# Patient Record
Sex: Male | Born: 1935 | Race: White | Hispanic: No | Marital: Married | Smoking: Former smoker
Health system: Southern US, Community
[De-identification: ages and names within clinical notes are randomized; demographics above are authoritative.]

## PROBLEM LIST (undated history)

## (undated) DIAGNOSIS — F0390 Unspecified dementia without behavioral disturbance: Secondary | ICD-10-CM

## (undated) DIAGNOSIS — K222 Esophageal obstruction: Secondary | ICD-10-CM

## (undated) DIAGNOSIS — M1990 Unspecified osteoarthritis, unspecified site: Secondary | ICD-10-CM

## (undated) DIAGNOSIS — K219 Gastro-esophageal reflux disease without esophagitis: Secondary | ICD-10-CM

## (undated) DIAGNOSIS — E785 Hyperlipidemia, unspecified: Secondary | ICD-10-CM

## (undated) DIAGNOSIS — I10 Essential (primary) hypertension: Secondary | ICD-10-CM

## (undated) HISTORY — PX: OTHER SURGICAL HISTORY: SHX169

## (undated) SURGERY — EGD (ESOPHAGOGASTRODUODENOSCOPY)
Anesthesia: Moderate Sedation

---

## 2004-03-20 ENCOUNTER — Ambulatory Visit (HOSPITAL_COMMUNITY): Admission: RE | Admit: 2004-03-20 | Discharge: 2004-03-20 | Payer: Self-pay | Admitting: Cardiology

## 2004-03-24 ENCOUNTER — Ambulatory Visit (HOSPITAL_COMMUNITY): Admission: RE | Admit: 2004-03-24 | Discharge: 2004-03-24 | Payer: Self-pay | Admitting: Cardiology

## 2004-03-25 ENCOUNTER — Ambulatory Visit (HOSPITAL_COMMUNITY): Admission: RE | Admit: 2004-03-25 | Discharge: 2004-03-25 | Payer: Self-pay | Admitting: Cardiology

## 2004-08-18 ENCOUNTER — Ambulatory Visit (HOSPITAL_BASED_OUTPATIENT_CLINIC_OR_DEPARTMENT_OTHER): Admission: RE | Admit: 2004-08-18 | Discharge: 2004-08-18 | Payer: Self-pay | Admitting: Family Medicine

## 2005-02-05 ENCOUNTER — Ambulatory Visit: Payer: Self-pay | Admitting: Cardiology

## 2005-05-24 ENCOUNTER — Ambulatory Visit (HOSPITAL_COMMUNITY): Admission: RE | Admit: 2005-05-24 | Discharge: 2005-05-24 | Payer: Self-pay | Admitting: Gastroenterology

## 2006-03-03 ENCOUNTER — Ambulatory Visit: Payer: Self-pay | Admitting: Cardiology

## 2007-03-20 ENCOUNTER — Ambulatory Visit: Payer: Self-pay | Admitting: Cardiology

## 2007-03-29 ENCOUNTER — Ambulatory Visit: Payer: Self-pay

## 2007-03-29 LAB — CONVERTED CEMR LAB
Chloride: 106 meq/L (ref 96–112)
Cholesterol: 261 mg/dL (ref 0–200)
Direct LDL: 177 mg/dL
GFR calc Af Amer: 123 mL/min
GFR calc non Af Amer: 102 mL/min
HDL: 48.3 mg/dL (ref >39.0)
Potassium: 4.7 meq/L (ref 3.5–5.1)
Sodium: 142 meq/L (ref 135–145)
Total CHOL/HDL Ratio: 5.4
Triglycerides: 165 mg/dL — ABNORMAL HIGH (ref 0–149)
VLDL: 33 mg/dL (ref 0–40)

## 2007-06-02 ENCOUNTER — Ambulatory Visit: Payer: Self-pay | Admitting: Cardiology

## 2007-06-02 LAB — CONVERTED CEMR LAB
ALT: 43 units/L (ref 0–53)
AST: 39 units/L — ABNORMAL HIGH (ref 0–37)
Alkaline Phosphatase: 55 units/L (ref 39–117)
Total Bilirubin: 1.1 mg/dL (ref 0.3–1.2)
Total Protein: 7.5 g/dL (ref 6.0–8.3)

## 2008-03-07 ENCOUNTER — Ambulatory Visit: Payer: Self-pay

## 2008-03-07 ENCOUNTER — Ambulatory Visit: Payer: Self-pay | Admitting: Cardiology

## 2008-03-07 LAB — CONVERTED CEMR LAB
Basophils Absolute: 0.1 10*3/uL (ref 0.0–0.1)
Chloride: 104 meq/L (ref 96–112)
Creatinine, Ser: 0.8 mg/dL (ref 0.4–1.5)
Eosinophils Absolute: 0.1 10*3/uL (ref 0.0–0.7)
GFR calc Af Amer: 123 mL/min
GFR calc non Af Amer: 101 mL/min
HCT: 44.7 % (ref 39.0–52.0)
MCHC: 34.2 g/dL (ref 30.0–36.0)
MCV: 98.5 fL (ref 78.0–100.0)
Magnesium: 2.1 mg/dL (ref 1.5–2.5)
Monocytes Absolute: 0.6 10*3/uL (ref 0.1–1.0)
Neutrophils Relative %: 58.8 % (ref 43.0–77.0)
Platelets: 223 10*3/uL (ref 150–400)
Potassium: 4.1 meq/L (ref 3.5–5.1)

## 2009-01-03 ENCOUNTER — Ambulatory Visit (HOSPITAL_COMMUNITY): Admission: RE | Admit: 2009-01-03 | Discharge: 2009-01-03 | Payer: Self-pay | Admitting: Gastroenterology

## 2009-04-29 ENCOUNTER — Ambulatory Visit (HOSPITAL_COMMUNITY): Admission: RE | Admit: 2009-04-29 | Discharge: 2009-04-29 | Payer: Self-pay | Admitting: Gastroenterology

## 2009-05-14 ENCOUNTER — Ambulatory Visit (HOSPITAL_COMMUNITY): Admission: RE | Admit: 2009-05-14 | Discharge: 2009-05-14 | Payer: Self-pay | Admitting: Gastroenterology

## 2009-05-19 ENCOUNTER — Encounter: Admission: RE | Admit: 2009-05-19 | Discharge: 2009-05-19 | Payer: Self-pay | Admitting: Family Medicine

## 2010-11-04 ENCOUNTER — Ambulatory Visit (HOSPITAL_COMMUNITY): Admission: RE | Admit: 2010-11-04 | Discharge: 2010-11-04 | Payer: Self-pay | Admitting: Gastroenterology

## 2010-12-27 ENCOUNTER — Encounter: Payer: Self-pay | Admitting: Gastroenterology

## 2011-03-29 ENCOUNTER — Other Ambulatory Visit (HOSPITAL_COMMUNITY): Payer: Self-pay | Admitting: Gastroenterology

## 2011-04-02 ENCOUNTER — Ambulatory Visit (HOSPITAL_COMMUNITY)
Admission: RE | Admit: 2011-04-02 | Discharge: 2011-04-02 | Disposition: A | Discharge status: Home / Self Care | Payer: Medicare Other | Source: Ambulatory Visit | Attending: Gastroenterology | Admitting: Gastroenterology

## 2011-04-02 DIAGNOSIS — R05 Cough: Secondary | ICD-10-CM | POA: Insufficient documentation

## 2011-04-02 DIAGNOSIS — K219 Gastro-esophageal reflux disease without esophagitis: Secondary | ICD-10-CM | POA: Insufficient documentation

## 2011-04-02 DIAGNOSIS — R0989 Other specified symptoms and signs involving the circulatory and respiratory systems: Secondary | ICD-10-CM | POA: Insufficient documentation

## 2011-04-02 DIAGNOSIS — R059 Cough, unspecified: Secondary | ICD-10-CM | POA: Insufficient documentation

## 2011-04-20 NOTE — Op Note (Signed)
Jeremy Briggs, PRIVITERA                ACCOUNT NO.:  [id number]   MEDICAL RECORD NO.:  [id number]          PATIENT TYPE:  AMB   LOCATION:  ENDO                         FACILITY:  [facility]   PHYSICIAN:  Shirley Friar, MDDATE OF BIRTH:  [birth year]   DATE OF PROCEDURE:  01/03/2009  DATE OF DISCHARGE:                               OPERATIVE REPORT   INDICATIONS:  Food impaction, history of esophageal stricture.   MEDICATIONS:  Fentanyl 125 mcg IV, Versed 12.5 mg IV.   FINDINGS:  Endoscope was inserted through the oropharynx and esophagus  was intubated.  The endoscope was passed down into the distal part of  the esophagus where large food bolus was obscuring the entire lumen of  the esophagus.  A Lucina Mellow net was used with multiple passes to try and  remove this food bolus, but it was only partially successful.  A rat  tooth forceps was then used to pull off pieces of this food bolus and  this was used on several attempts.  Majority of the food bolus still  remained in place and decision was made to use a tripod.  A tripod was  inserted and with repeated passes, majority of the food bolus was  removed with the tripod.  The remaining portion of the food bolus could  be gently advanced into the lumen of the stomach.  This revealed a  benign-appearing distal esophageal stricture with evidence of mucosal  erythema, edema, and ulceration due to pressure from the food bolus.  Endoscope was then advanced down into the stomach, which revealed normal  distal stomach.  The endoscope was retroflexed and small hiatal hernia  was seen.  Endoscope was straightened and advanced to the duodenal bulb  and second portion of duodenum, which were both normal.  Endoscope was  withdrawn back into the stomach and large amount of food particles and  liquid were seen in the dependent portion of the fundus.  Endoscope was  withdrawn to confirm the above findings.   ASSESSMENT:  1. Large food bolus blocking the  lumen of the distal esophagus,      removed with various techniques as stated above.  2. Benign appearing distal esophageal stricture.  3. Small hiatal hernia.   PLAN:  1. Increase proton pump inhibitor to twice a day by mouth.  2. Repeat upper endoscopy in 2-3 weeks for balloon dilation versus      Savary dilation.  3. OV in 2 weeks.  4. Liquid diet and advance slowly as tolerated.      Shirley Friar, MD  Electronically Signed    VCS/MEDQ  D:  01/03/2009  T:  01/04/2009  Job:  [id number]   cc:   Tally Joe, M.D.

## 2011-04-20 NOTE — Op Note (Signed)
NAMEMOTTY, BORIN                ACCOUNT NO.:  [id number]   MEDICAL RECORD NO.:  [id number]          PATIENT TYPE:  AMB   LOCATION:  ENDO                         FACILITY:  [facility]   PHYSICIAN:  Shirley Friar, MDDATE OF BIRTH:  Dec 24, 1933   DATE OF PROCEDURE:  DATE OF DISCHARGE:  05/14/2009                               OPERATIVE REPORT   INDICATION:  Dysphagia, history of esophageal stricture.   MEDICATIONS:  Fentanyl 50 mcg IV, Versed 3 mg IV, and Cetacaine spray  x2.   FINDINGS:  Endoscope was inserted into the oropharynx and esophagus was  intubated, which revealed a distal benign-appearing esophageal  stricture.  The standard endoscope was inserted through this without any  resistance down to the stomach which revealed normal stomach mucosa.  Retroflexion was done, which revealed normal proximal stomach.  Endoscope was straightened and advanced into the duodenal bulb and  second portion of duodenum which were both normal.  Endoscope was  withdrawn back into the esophagus and a small amount of blood from  endoscopic trauma from insertion through the esophageal stricture.  Decision was made to use a balloon dilator for dilation.  Balloon  dilator was started 15 mm x 5.5 cm and held for 1 minute across the  esophageal stricture.  The balloon was then inflated up to 16.5 mm x 5.5  cm after looking at the stricture following the 15-mm dilation.  Following this 16.5-mm dilation, there was evidence of successful  dilation of esophageal stricture.  Each balloon dilator was held for 1  minute in sequential fashion.  Endoscope was then withdrawn to confirm  above findings.   ASSESSMENT:  1. Distal esophageal stricture, status post balloon dilation of the      16.5 mm.  2. Otherwise, normal upper endoscopy.   PLAN:  1. No NSAIDs x14 days.  2. Advance diet as tolerated.  3. Continue PPI therapy daily.  4. Follow up in office in 6 weeks.      Shirley Friar, MD  Electronically Signed     VCS/MEDQ  D:  05/14/2009  T:  05/15/2009  Job:  [id number]   cc:   Melida Quitter, M.D.

## 2011-04-20 NOTE — Assessment & Plan Note (Signed)
[facility]                            CARDIOLOGY OFFICE NOTE   Jeremy Briggs, Jeremy Briggs                       MRN:          [id number]  DATE:03/07/2008                            DOB:          27-Dec-1933    PRIMARY CARE PHYSICIAN:  Holley Bouche, M.D.   CLINICAL HISTORY:  Jeremy Briggs is 77 years old and returns for evaluation  and management of palpitations.  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  We evaluated him last  year for symptoms of shortness of breath with a Myoview scan which was  negative and showed no evidence of ischemia.  He also had pulmonary  function tests which were only mildly abnormal and he had a CT scan of  the chest which was normal.  He previously had been a smoker, but  stopped.   Recently, he has had symptoms of palpitations and dizziness.  He  describes the palpitations as feeling his heart beating irregularly and  this happens daily and mostly at night when he lies down.  Dr. Tiburcio Pea  saw him and did an ECG and told him he had APCs.  He has also had some  dizziness which he describes as a feeling of unsteadiness which  sometimes is related to position and sometimes not.   PAST MEDICAL HISTORY:  1. Significant for hypertension.  2. Hyperlipidemia.  3. Obstructive sleep apnea, although he does not use BiPAP.   ALLERGIES:  1. STATINS.  2. ZETIA.   CURRENT MEDICATIONS:  1. Aspirin.  2. Omega 3.  3. Glucosamine.  4. Prilosec.  5. Diovan 160 mg daily.   PHYSICAL EXAMINATION:  VITAL SIGNS:  Blood pressure 159/84, pulse 84 and  regular.  NECK:  There was no venous distension.  Carotid pulses were full without  bruits.  CHEST:  Clear.  CARDIAC:  Rhythm was somewhat irregular.  There were no murmurs and no  gallops.  The heart sounds were normal.  ABDOMEN:  Soft with normal bowel sounds.  There is no  hepatosplenomegaly.  EXTREMITIES:  Peripheral pulses were full.   There  is no peripheral edema.   DIAGNOSTICS:  Electrocardiogram showed minor nonspecific ST/T changes.   IMPRESSION:  1. Palpitations with documented atrial premature contractions on 12-      lead electrocardiogram.  2. Mild nonobstructive coronary disease at cath in 2005.  3. Hyperlipidemia intolerant to statins.  4. Hypertension.  5. Positive family history for coronary heart disease.  6. Obstructive sleep apnea intolerant to bilevel positive airway      pressure.   RECOMMENDATIONS:  Jeremy Briggs is symptomatic from his palpitations.  I  think the important thing is to see if there is any atrial fibrillation  which would put him at risk of stroke and which might require further  anticoagulant therapy.  We will plan to get a 24 hour Holter monitor  since he has symptoms every day.  After the monitor, we will start him  on atenolol 25 a day.  I will decide about followup after we see  the  results of his monitor.  We will also get a CBC, BMP, TSH and magnesium.     Bruce Elvera Lennox Juanda Chance, MD, [facility]  Electronically Signed    BRB/MedQ  DD: 03/07/2008  DT: 03/07/2008  Job #: [id number]

## 2011-04-20 NOTE — Op Note (Signed)
NAMECOLTRANE, TUGWELL                ACCOUNT NO.:  [id number]   MEDICAL RECORD NO.:  [id number]          PATIENT TYPE:  AMB   LOCATION:  ENDO                         FACILITY:  [facility]   PHYSICIAN:  Danise Edge, M.D.   DATE OF BIRTH:  1934-09-09   DATE OF PROCEDURE:  DATE OF DISCHARGE:  04/29/2009                               OPERATIVE REPORT   REFERRING PHYSICIANS:  1. Shirley Friar, MD  2. Noberto Retort, MD   HISTORY:  Mr. Jeremy Briggs is a 77 year old male born 12-14-1933.  Mr. Jeremy Briggs comes into the endoscopy suite this morning with his second  food bolus obstruction of the distal esophagus due to a distal  esophageal stricture which he declined to have dilated after his first  obstruction.  He was eating cube steak last night which initiated his  esophageal obstruction.  He is unable to swallow saliva.   MEDICATION ALLERGIES:  None.   CHRONIC MEDICATIONS:  Diovan, 81 mg aspirin, sertraline, multivitamin,  fish oil, glucosamine, and omeprazole.   PAST MEDICAL HISTORY:  1. Gastroesophageal reflux associated with a hiatal hernia.  2. Chronic obstructive pulmonary disease.  3. Obstructive sleep apnea syndrome.   PAST SURGICAL HISTORY:  No previous surgeries.   FAMILY HISTORY:  Negative for colon cancer or colon polyps.   SOCIAL HISTORY:  The patient does not smoke cigarettes.  He consumes  alcohol in moderation.  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  He is married.   ENDOSCOPIST:  Danise Edge, MD   PREMEDICATIONS:  Fentanyl 75 mcg and Versed 5 mg.   PROCEDURE:  After obtaining informed consent, Jeremy Briggs was placed in  the left lateral decubitus position.  I administered intravenous  fentanyl and intravenous Versed to achieve conscious sedation for the  procedure.  The patient's blood pressure, oxygen saturation and cardiac  rhythm were monitored throughout the procedure and documented in the  medical record.   The Pentax gastroscope was  passed through the posterior hypopharynx into  the proximal esophagus without difficulty.  The hypopharynx, larynx and  vocal cords appeared normal.   ESOPHAGOSCOPY:  The proximal and mid segments of the esophagus appeared  normal.  There is meat, totally obstructing the distal esophagus.  Using  the [employer] a portion of the obstructing meat bolus was removed.  With  gentle pressure with the endoscope the remaining obstructing meat bolus  was pushed into the stomach.  The squamocolumnar junction is noted to be  40 cm from the incisor teeth.  The distal esophagus is quite inflamed  and friable due to the obstructing meat bolus.  There is a benign  appearing stricture at the esophagogastric junction noted at 40 cm from  the incisor teeth.   GASTROSCOPY:  There was a moderate-sized hiatal hernia.  Retroflexed  view of the gastric cardia and fundus was normal.  The gastric body,  antrum and pylorus appeared normal.   DUODENOSCOPY:  The duodenal bulb and descending duodenum appeared  normal.   ASSESSMENT:  Chronic gastroesophageal reflux disease associated with a  hiatal  hernia complicated by a benign peptic stricture at the  esophagogastric junction (40 cm from the incisor teeth).  Obstructing  meat bolus in the distal esophagus relieved.   RECOMMENDATIONS:  I will ask the patient to remain off multivitamins,  aspirin and meat until he can get his esophageal stricture dilated.           ______________________________  Danise Edge, M.D.     MJ/MEDQ  D:  04/29/2009  T:  04/30/2009  Job:  [id number]   cc:   Shirley Friar, MD  Melida Quitter, M.D.

## 2011-04-23 NOTE — Cardiovascular Report (Signed)
Jeremy Briggs, Jeremy Briggs                          ACCOUNT NO.:  [id number]   MEDICAL RECORD NO.:  [id number]                   PATIENT TYPE:  OIB   LOCATION:  2899                                 FACILITY:  [facility]   PHYSICIAN:  Armanda Magic, M.D.                  DATE OF BIRTH:  1934-06-18   DATE OF PROCEDURE:  03/20/2004  DATE OF DISCHARGE:                              CARDIAC CATHETERIZATION   REFERRING PHYSICIAN:  Hal T. Stoneking, M.D.   This is a 77 year old white male with a family history of coronary disease,  history of hyperlipidemia, and tobacco use in the past who presents with  dyspnea on exertion of unclear etiology. Also has new abnormal EKG. Stress  Cardiolite showing a partially reversible inferior septal defect with normal  EF. He now presents for cardiac catheterization.   The patient was brought to the cardiac catheterization laboratory in the  fasting nonsedated stated. Informed consent was obtained. The patient was  connected to continuous heart rate and pulse oximetry monitoring and  intermittent blood pressure monitoring. The right groin was prepped and  draped in sterile fashion. One percent Xylocaine was used for local  anesthesia. Using modified Seldinger technique, a 6-French sheath was placed  in the right femoral artery. Under fluoroscopic guidance, a 6-French JL4  catheter was placed in the left coronary artery. Multiple films were taken  in 30 degree RAO and 40 degree LAO views. This catheter was then exchanged  out over a guide wire for a 6-French JR4 catheter which was placed under  fluoroscopic guidance in the right coronary artery. Multiple _________ films  were taken in 30 degree RAO and 40 degree LAO views. This catheter was then  exchanged out over a guide wire for a 6-French angled pigtailed catheter  which was placed under fluoroscopic guidance in the left ventricular cavity.  Left ventriculography was performed in 30 degree RAO views using a total  of  30 cc of contrast at 15 cc per second. The catheter was then pulled back  across the valve with no significant gradient noted. At the end of the  procedure, all catheters and sheaths were removed. Manual compression was  performed until adequate hemostasis was obtained. The patient was  transferred back to the room in stable condition.   RESULTS:  Left main coronary artery is widely patent. It bifurcates into the  left anterior descending and left circumflex artery. Left anterior  descending artery is widely patent throughout its course. The apex was a mid  40 to 50% narrowing between the first and second diagonal. Both diagonals  were widely patent. The left circumflex was a single vessel. It transverses  the AV groove. It has a 20% ostial narrowing but otherwise is widely patent  throughout its course. The right coronary artery is widely patent throughout  its course with luminal irregularities up to 20% and bifurcates distally  into  a posterior descending artery and posterolateral artery, both of which  are widely patent.   Left ventriculography shows normal LV systolic function and size. No MR. LV  pressure 175/24 mmHg. Aortic pressure 181/105 mmHg. LVEDP 27 mmHg.   ASSESSMENT:  1. Nonobstructive coronary disease.  2. Dyspnea on exertion of questionable etiology, probably either due to     underlying pulmonary abnormality from smoking history versus diastolic     dysfunction given his elevated LVEDP.  3. Hyperlipidemia on Crestor.  4. Borderline diabetes mellitus.   PLAN:  1. Discharged to home after bedrest and IV fluids.  2. Add Cardizem CD 180 mg a day for probable diastolic dysfunction.  3. Check a fasting lipid panel as an outpatient.  4. Check pulmonary function tests with DLCO as an outpatient.  5. Chest CT with contrast as an outpatient to rule out chronic pulmonary     embolism as a cause of shortness of breath.  6. Continue aspirin and Crestor.  7. Check a 2-D  echocardiogram to evaluate for left ventricular hypertrophy     versus end-diastolic dysfunction.                                               Armanda Magic, M.D.    TT/MEDQ  D:  03/20/2004  T:  03/20/2004  Job:  [id number]

## 2011-04-23 NOTE — Assessment & Plan Note (Signed)
[facility]                            CARDIOLOGY OFFICE NOTE   Jeremy Briggs, Jeremy Briggs                       MRN:          [id number]  DATE:03/20/2007                            DOB:          August 17, 1934    PRIMARY CARE PHYSICIAN:  Jeremy Briggs, M.D. with [facility].   CLINICAL COURSE:  Mr. Jeremy Briggs is 77 years old and is semi-retired.  He  works in Research officer, political party.  He has hypertension, hyperlipidemia and non-  obstructive coronary disease.  He had been previously cathed by Dr.  Mayford Briggs, and then I saw him at Jeremy Briggs' request.   I evaluated him a little more than a year ago for exertional dyspnea,  and we did pulmonary function tests which were close to normal, despite  a previous smoking history.  We also did a CT scan of the chest which  was normal, despite previous smoking.  We thought his dyspnea was  related to his conditioning.   He says his dyspnea has gotten somewhat worse over the past year.  He  has not had any chest pain associated with his dyspnea.   PAST MEDICAL HISTORY:  1. Hyperlipidemia.  2. Hypertension.  3. Obstructive sleep apnea, although he does not use BiPAP.   ALLERGIES:  He has been intolerant to STATINS.   CURRENT MEDICATIONS:  1. Aspirin.  2. Glucosamine.  3. Folic acid.  4. Prilosec.  5. Diovan.  6. Spiriva inhaler p.r.n., which was just recently given by Dr.      Tiburcio Briggs.   PHYSICAL EXAMINATION:  VITAL SIGNS:  On examination today, the blood  pressure was 177/87; on a repeat, I got 180/95, and the pulse was 77 and  regular.  NECK:  There was no venous distention.  The carotid pulses were full  without bruits.  CHEST:  Clear without rales or rhonchi.  CARDIAC:  Regular.  I could hear no murmurs or gallops.  ABDOMEN:  Soft without organomegaly.  Peripheral pulses were full.  There was no peripheral edema.   ELECTROCARDIOGRAM:  Minor nonspecific ST-T changes had not changed.   IMPRESSION:  1.  Dyspnea on exertion, somewhat worse over the past several months.      Rule out ischemia.  2. Nonobstructive coronary disease at catheterization in 2005.  3. Hypertension, now under good control.  4. Normal left ventricular function.  5. Hyperlipidemia.  6. Intolerance to statins.   RECOMMENDATIONS:  Jeremy Briggs blood pressure is now under good control.  Will increase his Diovan from 80 to 160 a day.  His dyspnea could  possibly be ischemic equivalent related to progression of his coronary  disease, and we did not evaluate him for this a year ago, so I think it  would be appropriate to do an exercise rest stress Myoview scan.  If  this is negative, then I would attribute his shortness of breath to  deconditioning and perhaps probably related to hypertension not under  optimal control.  Will recheck his blood pressure and a BNP when he  comes in for a stress test, and will  have him follow up with Jeremy Briggs  after that for further adjustments in therapy.     Jeremy Elvera Lennox Juanda Chance, MD, [facility]  Electronically Signed    BRB/MedQ  DD: 03/20/2007  DT: 03/21/2007  Job #: [id number]

## 2011-04-23 NOTE — Op Note (Signed)
Jeremy Briggs, Jeremy Briggs                ACCOUNT NO.:  [id number]   MEDICAL RECORD NO.:  [id number]          PATIENT TYPE:  AMB   LOCATION:  ENDO                         FACILITY:  [facility]   PHYSICIAN:  Danise Edge, M.D.   DATE OF BIRTH:  August 05, 1934   DATE OF PROCEDURE:  05/24/2005  DATE OF DISCHARGE:                                 OPERATIVE REPORT   PROCEDURE:  Esophagogastroduodenoscopy with Savary esophageal dilation.   INDICATIONS:  Mr. Norval Slaven is a 77 year old male born 11-01-1934.  Mr. Harriott has gastroesophageal reflux manifested by heartburn which is  controlled on his current dose of a proton pump inhibitor. He occasionally  has the sensation of large tablets that he swallows hanging up in the mid  esophagus. He reports no odynophagia or weight loss.   ENDOSCOPIST:  Danise Edge, M.D.   PREMEDICATION:  Versed 5 mg, Demerol 50 mg. .   DESCRIPTION OF PROCEDURE:  After obtaining informed consent, Mr. Pullin was  placed in the left lateral decubitus position on the fluoroscopy table. I  administered intravenous Demerol and intravenous Versed to achieve conscious  sedation for the procedure. The patient's blood pressure, oxygen saturation  and cardiac rhythm were monitored throughout the procedure and documented in  the medical record.   The Olympus gastroscope was passed through the posterior hypopharynx into  the proximal esophagus without difficulty. The hypopharynx, larynx and vocal  cords appeared normal.   ESOPHAGOSCOPY:  The proximal, mid and lower segments of the esophageal  mucosa appeared normal. The squamocolumnar junction is noted at 40 cm from  the incisor teeth. There may be a shallow stricture at the esophagogastric  junction. There is no endoscopic evidence for the presence of Barrett's  esophagus or erosive esophagitis.   GASTROSCOPY:  Mr. Nicklaus has a small hiatal hernia. Retroflexed view of the  gastric cardia and fundus was normal. The gastric  body, antrum and pylorus  appeared normal.   DUODENOSCOPY:  The duodenal bulb, mid duodenum and distal duodenum appeared  normal.   SAVARY ESOPHAGEAL DILATION:  The Savary dilator wire was passed through the  gastroscope and the tip of the guidewire advanced to the distal gastric  antrum as confirmed endoscopically and fluoroscopically. Under fluoroscopic  guidance, the 15 mm Savary dilator passed without resistance. Repeat  esophagogastroscopy revealed mucosal dilation associated with a scant amount  of fresh blood at the esophagogastric junction and no gastric trauma due to  the guidewire.   ASSESSMENT:  Gastroesophageal reflux associated with a small hiatal hernia  and shallow mucosal stricture at the esophagogastric junction (40 cm from  the incisor teeth) dilated with the 15 mm Savary dilator.   RECOMMENDATIONS:  Continue proton pump inhibitor therapy.       MJ/MEDQ  D:  05/24/2005  T:  05/24/2005  Job:  [id number]   cc:   Melida Quitter, M.D.  [address]  [place]  [place] [address]  Fax: [phone]

## 2011-04-23 NOTE — Procedures (Signed)
Jeremy Briggs, Jeremy Briggs                ACCOUNT NO.:  [id number]   MEDICAL RECORD NO.:  [id number]          PATIENT TYPE:  OUT   LOCATION:  SLEEP CENTER                 FACILITY:  [facility]   PHYSICIAN:  Clinton D. Maple Hudson, M.D. DATE OF BIRTH:  12-Oct-1934   DATE OF ADMISSION:  08/18/2004  DATE OF DISCHARGE:  08/18/2004                              NOCTURNAL POLYSOMNOGRAM   REFERRING PHYSICIAN:  Dr. Johny Blamer, IV   INDICATION FOR STUDY:  Hypersomnia with sleep apnea, nonrestorative sleep.   Epworth sleepiness score 1/24.  Neck size 17 inches.  BMI 26.  Weight 172  pounds.   MEDICATIONS:  1.  Diovan.  2.  Zocor.  3.  Multivitamins.  4.  Aspirin.  5.  Folic acid.  6.  Ambien or temazepam p.r.n.   Technician did not indicate that sleep medication was used this night.   SLEEP ARCHITECTURE:  Total sleep time 334 minutes with sleep efficiency 82%.  Stage I was 7%, stage II 75%, stages III and IV 2%.  REM was 15% of total  sleep time.  Latency to sleep onset 14 minutes.  Latency to REM 72 minutes.  Awake after sleep onset 59 minutes.  Arousal index 39.   RESPIRATORY DATA:  Split study protocol.  RDI 25/hour consistent with  moderate obstructive sleep apnea/hypopnea syndrome for CPAP titration.  This  included 40 obstructive apneas, 4 central apneas, 28 hypopneas.  Most events  were while sleeping supine.  REM RDI was 4.7/hour.  CPAP was titrated to 15  CWP, RDI 0/hour using a small Respironics Comfort Gel nasal mask with heated  humidifier.  Technician suggests a chin strap initially.   OXYGEN DATA:  Moderate to loud snoring with mild oxygen desaturation to a  nadir of 85% before CPAP.  After CPAP control, saturation held 95% on room  air.   CARDIAC DATA:  Normal sinus rhythm with rare PVC.   MOVEMENT/PARASOMNIA:  257 limb jerks were recorded of which 60 were  associated with arousal or awakening for a periodic limb movement with  arousal index of 10.8/hour which is abnormal.   IMPRESSION/RECOMMENDATION:  Moderate obstructive sleep apnea/hypopnea  syndrome, RDI 25/hour with mild oxygen desaturation.  Successful CPAP  control at 15 CWP, RDI 0/hour, using a small Respironics comfort gel  nasal mask with heated humidifier.  Consider adding a chin strap.  Periodic  limb movement with arousal, 10.8/hour.  This can be reconsidered after  treatment for CPAP if appropriate.      CDY/MEDQ  D:  08/27/2004 13:15:20  T:  08/28/2004 18:10:34  Job:  [id number]

## 2012-01-27 ENCOUNTER — Encounter (HOSPITAL_COMMUNITY): Payer: Self-pay

## 2012-01-27 ENCOUNTER — Encounter (HOSPITAL_COMMUNITY)
Admission: EM | Disposition: A | Discharge status: Home Health | Payer: Self-pay | Source: Home / Self Care | Attending: Emergency Medicine

## 2012-01-27 ENCOUNTER — Ambulatory Visit (HOSPITAL_COMMUNITY)
Admission: EM | Admit: 2012-01-27 | Discharge: 2012-01-27 | Disposition: A | Discharge status: Home Health | Payer: Medicare Other | Attending: Gastroenterology | Admitting: Gastroenterology

## 2012-01-27 DIAGNOSIS — IMO0002 Reserved for concepts with insufficient information to code with codable children: Secondary | ICD-10-CM | POA: Insufficient documentation

## 2012-01-27 DIAGNOSIS — I10 Essential (primary) hypertension: Secondary | ICD-10-CM | POA: Insufficient documentation

## 2012-01-27 DIAGNOSIS — E785 Hyperlipidemia, unspecified: Secondary | ICD-10-CM | POA: Insufficient documentation

## 2012-01-27 DIAGNOSIS — K449 Diaphragmatic hernia without obstruction or gangrene: Secondary | ICD-10-CM | POA: Insufficient documentation

## 2012-01-27 DIAGNOSIS — K222 Esophageal obstruction: Secondary | ICD-10-CM | POA: Insufficient documentation

## 2012-01-27 DIAGNOSIS — T18108A Unspecified foreign body in esophagus causing other injury, initial encounter: Secondary | ICD-10-CM | POA: Insufficient documentation

## 2012-01-27 DIAGNOSIS — R1310 Dysphagia, unspecified: Secondary | ICD-10-CM

## 2012-01-27 HISTORY — PX: ESOPHAGOGASTRODUODENOSCOPY: SHX5428

## 2012-01-27 HISTORY — DX: Gastro-esophageal reflux disease without esophagitis: K21.9

## 2012-01-27 HISTORY — DX: Esophageal obstruction: K22.2

## 2012-01-27 HISTORY — DX: Hyperlipidemia, unspecified: E78.5

## 2012-01-27 HISTORY — DX: Essential (primary) hypertension: I10

## 2012-01-27 SURGERY — EGD (ESOPHAGOGASTRODUODENOSCOPY)
Anesthesia: Moderate Sedation

## 2012-01-27 MED ORDER — FENTANYL CITRATE 0.05 MG/ML IJ SOLN
INTRAMUSCULAR | Status: AC
  Filled 2012-01-27: qty 2

## 2012-01-27 MED ORDER — ASPIRIN 81 MG PO TABS: 81.0000 mg | ORAL_TABLET | Freq: Every morning | ORAL | Status: DC

## 2012-01-27 MED ORDER — BUTAMBEN-TETRACAINE-BENZOCAINE 2-2-14 % EX AERO
INHALATION_SPRAY | CUTANEOUS | Status: DC | PRN
  Administered 2012-01-27: 2 via TOPICAL

## 2012-01-27 MED ORDER — MIDAZOLAM HCL 10 MG/2ML IJ SOLN
INTRAMUSCULAR | Status: AC
  Filled 2012-01-27: qty 2

## 2012-01-27 MED ORDER — FENTANYL NICU IV SYRINGE 50 MCG/ML
INJECTION | INTRAMUSCULAR | Status: DC | PRN
  Administered 2012-01-27 (×2): 25 ug via INTRAVENOUS

## 2012-01-27 MED ORDER — SODIUM CHLORIDE 0.9 % IV SOLN: Freq: Once | INTRAVENOUS | Status: DC

## 2012-01-27 MED ORDER — MIDAZOLAM HCL 10 MG/2ML IJ SOLN
INTRAMUSCULAR | Status: DC | PRN
  Administered 2012-01-27: 1 mg via INTRAVENOUS
  Administered 2012-01-27: 2 mg via INTRAVENOUS
  Administered 2012-01-27: 1 mg via INTRAVENOUS

## 2012-01-27 MED ORDER — SODIUM CHLORIDE 0.9 % IV SOLN
Freq: Once | INTRAVENOUS | Status: AC
  Administered 2012-01-27: 11:00:00 via INTRAVENOUS

## 2012-01-27 NOTE — ED Notes (Signed)
Pt states that since last night after eating an apple he feels like there is food stuck in his lower esophagus. He tried to eat some bread and drink water but he was unable to swallow and vomited everything back up. Pt has hx of the same and has been stretched a few times. Dr. Bosie Clos is pcp that helps with this issue.

## 2012-01-27 NOTE — Discharge Instructions (Signed)
Clear liquids only today slowly advance to soft solids tomorrow may resume aspirin tomorrow if doing well continue Prilosec/omeprazole and followup with my partner Dr. Bosie Clos in a few weeks to decide if repeat dilation as needed and call sooner when necessary if increased question or problem

## 2012-01-27 NOTE — Consult Note (Signed)
Reason for Consult: Probable food impaction Referring Physician: ER physician  Jeremy Briggs is an 78 y.o. male.  HPI: Patient with a history of 2 or 3 previous food impactions as well as a dilation by my partner who was eating both chicken and apple yesterday and felt it get caught and has been unable to drink water since. It usually happens with meat and he has not had any other medical issues or any problems with his previous endoscopy. His family history is pertinent for other family members and the dilation and he is on an aspirin a day but no other blood thinners and no other complaints Past Medical History  Diagnosis Date  . Esophageal stricture   . Hypertension   . Hyperlipemia   . GERD (gastroesophageal reflux disease)     Past Surgical History  Procedure Date  . Esophageal stret     History reviewed. No pertinent family history.  Social History:  reports that he has quit smoking. He does not have any smokeless tobacco history on file. He reports that he drinks alcohol. He reports that he does not use illicit drugs.  Allergies:  Allergies  Allergen Reactions  . Other Other (See Comments)    Statins "goes crazy"    Medications: I have reviewed the patient's current medications.  No results found for this or any previous visit (from the past 48 hour(s)).  No results found.  ROS negative except above Blood pressure 145/81, pulse 77, temperature 98.8 F (37.1 C), temperature source Oral, resp. rate 20, SpO2 98.00%. Physical Exam Vital signs stable afebrile no acute distress lungs are clear heart regular rate and rhythm abdomen is soft nontender office chart reviewed Assessment/Plan: Probable food impaction in patient with a history of dysphasia and dilations in the past Plan: The risks benefits methods of EGD was discussed with the patient and the wife and will proceed ASAP with further workup and plans pending those findings   Terryann Verbeek E 01/27/2012, 11:40 AM

## 2012-01-27 NOTE — ED Notes (Signed)
Pt states that this episode happened yesterday around 1500.  States this has happened to him in the past and he has had to go to endoscopy to get food removed.

## 2012-01-27 NOTE — Op Note (Signed)
[facility] [address] [place], [place]  [address]  ENDOSCOPY PROCEDURE REPORT  PATIENT:  Jeremy Briggs, Golda  MR#:  [id number] BIRTHDATE:  01/24/1934, 77 yrs. old  GENDER:  male  ENDOSCOPIST:  Vida Rigger, MD Referred by:   ER physician  PROCEDURE DATE:  01/27/2012 PROCEDURE:  EGD with foreign body removal ASA CLASS:  Class II INDICATIONS:  food impaction  MEDICATIONS:  50 mcg fentanyl 5 mg Versed TOPICAL ANESTHETIC: Used  DESCRIPTION OF PROCEDURE:   After the risks benefits and alternatives of the procedure were thoroughly explained, informed consent was obtained.  The Pentax Gastroscope [id number] endoscope was introduced through the mouth and advanced to the distal esophagus where obvious food was found. We initially tried to grab it with the snare and was unsuccessful due to her increased spasm so the snare was removed and a tripod grasper was advanced and a large bolus offood was grabbed and removed. The scope was then inserted and easily advanced to the Bulb of duodenum, without limitations.  The instrument was slowly withdrawn as the mucosa was fully examined. There was no additional food seen the other findings are recorded below the patient tolerated the procedure well there was no obvious immediate complication <<PROCEDUREIMAGES>>  FINDINGS 1. Obvious food impaction status post removal 2. Hiatal hernia with obvious widely patent fibrous ring proximally 3. Otherwise within normal limits to the duodenal bulb  COMPLICATIONS:  None  ENDOSCOPIC IMPRESSION: Above  RECOMMENDATIONS: Clear liquids today continue pump inhibitors chew food well followup with my partner Dr. Bosie Clos to decide if repeat dilation as needed and call sooner when necessary  REPEAT EXAM:  As needed  ______________________________ Vida Rigger, MD  CC:  n. eSIGNEDVida Rigger at 01/27/2012 12:29 PM  Jaquelyn Bitter, [phone]

## 2012-01-27 NOTE — ED Provider Notes (Signed)
History     CSN: [id number]  Arrival date & time 01/27/12  0930   First MD Initiated Contact with Patient 01/27/12 [id number]      Chief Complaint  Patient presents with  . Abdominal Pain    hx of esophageal stricture, feels like food is stuck in his throat, stomach area.     (Consider location/radiation/quality/duration/timing/severity/associated sxs/prior treatment) Patient is a 78 y.o. male presenting with foreign body swallowed. The history is provided by the patient.  Swallowed Foreign Body This is a recurrent problem. Episode onset: He was eating an apple last night and feels it became lodged in lower esophagus. The problem occurs constantly. The problem has been unchanged. Pertinent negatives include no chills or fever. Associated symptoms comments: He has a previous history of esophageal stricture requiring multiple dilations, the last of which was November 2011. He is currently unable to swallow solids and is not tolerating his own secretions either. No significant pain. No other complaint.. The symptoms are aggravated by swallowing.    Past Medical History  Diagnosis Date  . Esophageal stricture   . Hypertension   . Hyperlipemia     Past Surgical History  Procedure Date  . Esophageal stret     History reviewed. No pertinent family history.  History  Substance Use Topics  . Smoking status: Former Games developer  . Smokeless tobacco: Not on file  . Alcohol Use: Yes      Review of Systems  Constitutional: Negative for fever and chills.  HENT:       See HPI.  Respiratory: Negative.   Cardiovascular: Negative.   Gastrointestinal: Negative.   Musculoskeletal: Negative.   Skin: Negative.   Neurological: Negative.     Allergies  Other  Home Medications   Current Outpatient Rx  Name Route Sig Dispense Refill  . ASPIRIN 81 MG PO TABS Oral Take 81 mg by mouth every morning.    Marland Kitchen EZETIMIBE 10 MG PO TABS Oral Take 10 mg by mouth every morning.    Marland Kitchen FISH OIL PO Oral  Take 3 capsules by mouth daily.    Marland Kitchen OMEPRAZOLE PO Oral Take 1 capsule by mouth every morning.      BP 129/82  Pulse 76  Temp(Src) 97.6 F (36.4 C) (Oral)  Resp 20  SpO2 96%  Physical Exam  Constitutional: He appears well-developed and well-nourished.       He is currently spitting saliva into emesis bag.  HENT:  Head: Normocephalic.  Neck: Normal range of motion. Neck supple.  Cardiovascular: Normal rate and regular rhythm.   Pulmonary/Chest: Effort normal and breath sounds normal. He has no wheezes. He has no rales.  Abdominal: Soft. Bowel sounds are normal. There is no tenderness. There is no rebound and no guarding.  Musculoskeletal: Normal range of motion.  Neurological: He is alert. No cranial nerve deficit.  Skin: Skin is warm and dry. No rash noted.  Psychiatric: He has a normal mood and affect.    ED Course  Procedures (including critical care time)  Labs Reviewed - No data to display No results found.   No diagnosis found.    MDM          Rodena Medin, PA-C 01/27/12 1040

## 2012-01-27 NOTE — ED Provider Notes (Addendum)
Medical screening examination/treatment/procedure(s) were conducted as a shared visit with non-physician practitioner(s) and myself.  I personally evaluated the patient during the encounter 78-year-old male, with a history of esophageal such stricture, which was dilated 2 or 3 times by Dr. Bosie Clos in the past.  Presents to emergency department complaining of mild chest discomfort, and inability to eat, drink or swallow his saliva since last night after eating an apple.  He denies any other symptoms.  Damage and he is in no distress.  His heart and lungs are normal.  He is spitting up his saliva.  Continuously.  We will establish an IV and consult the gastroenterologist, for evaluation.  Nicholes Stairs, MD 01/27/12 1037  i spoke with dr. Bosie Clos.  He will have dr. Ewing Schlein come eval the pt in the ed.  Nicholes Stairs, MD 01/27/12 1045

## 2012-01-27 NOTE — ED Provider Notes (Signed)
Medical screening examination/treatment/procedure(s) were conducted as a shared visit with non-physician practitioner(s) and myself.  I personally evaluated the patient during the encounter  Nicholes Stairs, MD 01/27/12 [phone]

## 2012-01-28 ENCOUNTER — Encounter (HOSPITAL_COMMUNITY): Payer: Self-pay | Admitting: Gastroenterology

## 2012-02-24 ENCOUNTER — Other Ambulatory Visit: Payer: Self-pay | Admitting: Family Medicine

## 2012-02-24 DIAGNOSIS — R413 Other amnesia: Secondary | ICD-10-CM

## 2012-02-25 ENCOUNTER — Ambulatory Visit
Admission: RE | Admit: 2012-02-25 | Discharge: 2012-02-25 | Disposition: A | Discharge status: Home / Self Care | Payer: Medicare Other | Source: Ambulatory Visit | Attending: Family Medicine | Admitting: Family Medicine

## 2012-02-25 DIAGNOSIS — R413 Other amnesia: Secondary | ICD-10-CM

## 2013-08-09 ENCOUNTER — Other Ambulatory Visit: Payer: Self-pay | Admitting: Gastroenterology

## 2013-08-09 DIAGNOSIS — R1310 Dysphagia, unspecified: Secondary | ICD-10-CM

## 2013-08-13 ENCOUNTER — Encounter (HOSPITAL_COMMUNITY)
Admission: RE | Disposition: A | Discharge status: Home / Self Care | Payer: Self-pay | Source: Ambulatory Visit | Attending: Internal Medicine

## 2013-08-13 ENCOUNTER — Encounter (HOSPITAL_COMMUNITY): Payer: Self-pay | Admitting: *Deleted

## 2013-08-13 ENCOUNTER — Inpatient Hospital Stay (HOSPITAL_COMMUNITY)
Admission: RE | Admit: 2013-08-13 | Discharge: 2013-08-14 | DRG: 310 | Disposition: A | Discharge status: Home / Self Care | Payer: Medicare Other | Source: Ambulatory Visit | Attending: Internal Medicine | Admitting: Internal Medicine

## 2013-08-13 ENCOUNTER — Ambulatory Visit (HOSPITAL_COMMUNITY): Payer: Medicare Other

## 2013-08-13 ENCOUNTER — Ambulatory Visit
Admission: RE | Admit: 2013-08-13 | Discharge: 2013-08-13 | Disposition: A | Discharge status: Home / Self Care | Payer: Medicare Other | Source: Ambulatory Visit | Attending: Gastroenterology | Admitting: Gastroenterology

## 2013-08-13 DIAGNOSIS — W44F3XD Food entering into or through a natural orifice, subsequent encounter: Secondary | ICD-10-CM

## 2013-08-13 DIAGNOSIS — K269 Duodenal ulcer, unspecified as acute or chronic, without hemorrhage or perforation: Secondary | ICD-10-CM

## 2013-08-13 DIAGNOSIS — Z79899 Other long term (current) drug therapy: Secondary | ICD-10-CM

## 2013-08-13 DIAGNOSIS — T18128A Food in esophagus causing other injury, initial encounter: Secondary | ICD-10-CM

## 2013-08-13 DIAGNOSIS — IMO0002 Reserved for concepts with insufficient information to code with codable children: Secondary | ICD-10-CM | POA: Diagnosis present

## 2013-08-13 DIAGNOSIS — K2981 Duodenitis with bleeding: Secondary | ICD-10-CM | POA: Diagnosis present

## 2013-08-13 DIAGNOSIS — I7090 Unspecified atherosclerosis: Secondary | ICD-10-CM | POA: Diagnosis present

## 2013-08-13 DIAGNOSIS — Z7982 Long term (current) use of aspirin: Secondary | ICD-10-CM

## 2013-08-13 DIAGNOSIS — L723 Sebaceous cyst: Secondary | ICD-10-CM | POA: Diagnosis present

## 2013-08-13 DIAGNOSIS — I4892 Unspecified atrial flutter: Principal | ICD-10-CM

## 2013-08-13 DIAGNOSIS — E785 Hyperlipidemia, unspecified: Secondary | ICD-10-CM

## 2013-08-13 DIAGNOSIS — F0390 Unspecified dementia without behavioral disturbance: Secondary | ICD-10-CM

## 2013-08-13 DIAGNOSIS — G4733 Obstructive sleep apnea (adult) (pediatric): Secondary | ICD-10-CM | POA: Diagnosis present

## 2013-08-13 DIAGNOSIS — I4891 Unspecified atrial fibrillation: Secondary | ICD-10-CM | POA: Diagnosis present

## 2013-08-13 DIAGNOSIS — K219 Gastro-esophageal reflux disease without esophagitis: Secondary | ICD-10-CM | POA: Diagnosis present

## 2013-08-13 DIAGNOSIS — I10 Essential (primary) hypertension: Secondary | ICD-10-CM

## 2013-08-13 DIAGNOSIS — Z87891 Personal history of nicotine dependence: Secondary | ICD-10-CM

## 2013-08-13 DIAGNOSIS — T18108A Unspecified foreign body in esophagus causing other injury, initial encounter: Secondary | ICD-10-CM | POA: Diagnosis present

## 2013-08-13 DIAGNOSIS — R1310 Dysphagia, unspecified: Secondary | ICD-10-CM

## 2013-08-13 DIAGNOSIS — J4489 Other specified chronic obstructive pulmonary disease: Secondary | ICD-10-CM | POA: Diagnosis present

## 2013-08-13 DIAGNOSIS — Z9119 Patient's noncompliance with other medical treatment and regimen: Secondary | ICD-10-CM

## 2013-08-13 DIAGNOSIS — Z91199 Patient's noncompliance with other medical treatment and regimen due to unspecified reason: Secondary | ICD-10-CM

## 2013-08-13 DIAGNOSIS — K222 Esophageal obstruction: Secondary | ICD-10-CM | POA: Diagnosis present

## 2013-08-13 DIAGNOSIS — T18128D Food in esophagus causing other injury, subsequent encounter: Secondary | ICD-10-CM

## 2013-08-13 DIAGNOSIS — J449 Chronic obstructive pulmonary disease, unspecified: Secondary | ICD-10-CM | POA: Diagnosis present

## 2013-08-13 HISTORY — PX: ESOPHAGOGASTRODUODENOSCOPY: SHX5428

## 2013-08-13 HISTORY — PX: FOREIGN BODY REMOVAL: SHX962

## 2013-08-13 HISTORY — DX: Unspecified dementia, unspecified severity, without behavioral disturbance, psychotic disturbance, mood disturbance, and anxiety: F03.90

## 2013-08-13 LAB — COMPREHENSIVE METABOLIC PANEL
ALT: 12 U/L (ref 0–53)
AST: 17 U/L (ref 0–37)
CO2: 25 mEq/L (ref 19–32)
Chloride: 106 mEq/L (ref 96–112)
Creatinine, Ser: 0.95 mg/dL (ref 0.50–1.35)
GFR calc non Af Amer: 78 mL/min — ABNORMAL LOW (ref >90)
Sodium: 142 mEq/L (ref 135–145)
Total Bilirubin: 0.7 mg/dL (ref 0.3–1.2)

## 2013-08-13 LAB — CBC WITH DIFFERENTIAL/PLATELET
Basophils Absolute: 0.1 10*3/uL (ref 0.0–0.1)
HCT: 47.4 % (ref 39.0–52.0)
Lymphocytes Relative: 22 % (ref 12–46)
Monocytes Absolute: 1 10*3/uL (ref 0.1–1.0)
Neutro Abs: 6.7 10*3/uL (ref 1.7–7.7)
RBC: 5 MIL/uL (ref 4.22–5.81)
RDW: 12.7 % (ref 11.5–15.5)
WBC: 9.9 10*3/uL (ref 4.0–10.5)

## 2013-08-13 LAB — URINE MICROSCOPIC-ADD ON

## 2013-08-13 LAB — URINALYSIS, ROUTINE W REFLEX MICROSCOPIC
Glucose, UA: NEGATIVE mg/dL
Hgb urine dipstick: NEGATIVE
Ketones, ur: 15 mg/dL — AB
pH: 5 (ref 5.0–8.0)

## 2013-08-13 LAB — HEMOGLOBIN A1C
Hgb A1c MFr Bld: 5.7 % — ABNORMAL HIGH (ref <5.7)
Mean Plasma Glucose: 117 mg/dL — ABNORMAL HIGH (ref <117)

## 2013-08-13 SURGERY — EGD (ESOPHAGOGASTRODUODENOSCOPY)
Anesthesia: Moderate Sedation

## 2013-08-13 MED ORDER — GLUCAGON HCL (RDNA) 1 MG IJ SOLR
INTRAMUSCULAR | Status: AC
  Filled 2013-08-13: qty 1

## 2013-08-13 MED ORDER — DILTIAZEM HCL 30 MG PO TABS
30.0000 mg | ORAL_TABLET | Freq: Four times a day (QID) | ORAL | Status: DC
  Administered 2013-08-13 – 2013-08-14 (×2): 30 mg via ORAL
  Filled 2013-08-13 (×6): qty 1

## 2013-08-13 MED ORDER — MIDAZOLAM HCL 10 MG/2ML IJ SOLN
INTRAMUSCULAR | Status: DC | PRN
  Administered 2013-08-13 (×2): 1 mg via INTRAVENOUS
  Administered 2013-08-13: 2 mg via INTRAVENOUS
  Administered 2013-08-13: 1 mg via INTRAVENOUS
  Administered 2013-08-13: 2 mg via INTRAVENOUS

## 2013-08-13 MED ORDER — FENTANYL CITRATE 0.05 MG/ML IJ SOLN
INTRAMUSCULAR | Status: AC
  Filled 2013-08-13: qty 2

## 2013-08-13 MED ORDER — SODIUM CHLORIDE 0.9 % IJ SOLN: 3.0000 mL | Freq: Two times a day (BID) | INTRAMUSCULAR | Status: DC

## 2013-08-13 MED ORDER — ONDANSETRON HCL 4 MG/2ML IJ SOLN: 4.0000 mg | Freq: Four times a day (QID) | INTRAMUSCULAR | Status: DC | PRN

## 2013-08-13 MED ORDER — ENOXAPARIN SODIUM 40 MG/0.4ML ~~LOC~~ SOLN
40.0000 mg | SUBCUTANEOUS | Status: DC
  Administered 2013-08-13: 40 mg via SUBCUTANEOUS
  Filled 2013-08-13: qty 0.4

## 2013-08-13 MED ORDER — ACETAMINOPHEN 650 MG RE SUPP: 650.0000 mg | Freq: Four times a day (QID) | RECTAL | Status: DC | PRN

## 2013-08-13 MED ORDER — ACETAMINOPHEN 325 MG PO TABS: 650.0000 mg | ORAL_TABLET | Freq: Four times a day (QID) | ORAL | Status: DC | PRN

## 2013-08-13 MED ORDER — ONDANSETRON HCL 4 MG PO TABS: 4.0000 mg | ORAL_TABLET | Freq: Four times a day (QID) | ORAL | Status: DC | PRN

## 2013-08-13 MED ORDER — METOPROLOL TARTRATE 1 MG/ML IV SOLN: 5.0000 mg | INTRAVENOUS | Status: DC | PRN

## 2013-08-13 MED ORDER — SODIUM CHLORIDE 0.9 % IV SOLN
INTRAVENOUS | Status: DC
  Administered 2013-08-13: 500 mL via INTRAVENOUS

## 2013-08-13 MED ORDER — DILTIAZEM HCL 25 MG/5ML IV SOLN
10.0000 mg | Freq: Once | INTRAVENOUS | Status: AC
  Administered 2013-08-13: 10 mg via INTRAVENOUS
  Filled 2013-08-13 (×2): qty 5

## 2013-08-13 MED ORDER — SODIUM CHLORIDE 0.9 % IV SOLN
INTRAVENOUS | Status: DC
  Administered 2013-08-13: 18:00:00 via INTRAVENOUS

## 2013-08-13 MED ORDER — ALBUTEROL SULFATE (5 MG/ML) 0.5% IN NEBU: 2.5000 mg | INHALATION_SOLUTION | RESPIRATORY_TRACT | Status: DC | PRN

## 2013-08-13 MED ORDER — HEPARIN (PORCINE) IN NACL 100-0.45 UNIT/ML-% IJ SOLN
1000.0000 [IU]/h | INTRAMUSCULAR | Status: AC
  Administered 2013-08-14: 1000 [IU]/h via INTRAVENOUS
  Filled 2013-08-13: qty 250

## 2013-08-13 MED ORDER — EZETIMIBE 10 MG PO TABS
10.0000 mg | ORAL_TABLET | Freq: Every morning | ORAL | Status: DC
  Administered 2013-08-14: 10 mg via ORAL
  Filled 2013-08-13: qty 1

## 2013-08-13 MED ORDER — ASPIRIN 81 MG PO TABS
81.0000 mg | ORAL_TABLET | Freq: Every morning | ORAL | Status: DC
  Administered 2013-08-13: 81 mg via ORAL
  Filled 2013-08-13 (×3): qty 1

## 2013-08-13 MED ORDER — FENTANYL CITRATE 0.05 MG/ML IJ SOLN
INTRAMUSCULAR | Status: DC | PRN
  Administered 2013-08-13 (×3): 25 ug via INTRAVENOUS

## 2013-08-13 MED ORDER — PANTOPRAZOLE SODIUM 40 MG PO TBEC
40.0000 mg | DELAYED_RELEASE_TABLET | Freq: Two times a day (BID) | ORAL | Status: DC
  Administered 2013-08-13 – 2013-08-14 (×2): 40 mg via ORAL
  Filled 2013-08-13 (×5): qty 1

## 2013-08-13 MED ORDER — GLUCAGON HCL (RDNA) 1 MG IJ SOLR
INTRAMUSCULAR | Status: DC | PRN
  Administered 2013-08-13: .5 mg via INTRAVENOUS

## 2013-08-13 MED ORDER — MIDAZOLAM HCL 10 MG/2ML IJ SOLN
INTRAMUSCULAR | Status: AC
  Filled 2013-08-13: qty 2

## 2013-08-13 NOTE — H&P (Signed)
  Subjective:   Patient is a 79 y.o. male presents with food impaction since yesterday when he was eating chicken. He has had several prior dilatation for esophageal stricture. He was sent by Dr. Bosie Clos this morning for barium swallow. This showed food impaction the distal esophagus. Patient denies taking any blood thinners.   There are no active problems to display for this patient.  Past Medical History  Diagnosis Date  . Esophageal stricture   . Hypertension   . Hyperlipemia   . GERD (gastroesophageal reflux disease)     Past Surgical History  Procedure Laterality Date  . Esophageal stret    . Esophagogastroduodenoscopy  01/27/2012    Procedure: ESOPHAGOGASTRODUODENOSCOPY (EGD);  Surgeon: Petra Kuba, MD;  Location: [facility] ENDOSCOPY;  Service: Endoscopy;  Laterality: N/A;    Prescriptions prior to admission  Medication Sig Dispense Refill  . aspirin 81 MG tablet Take 1 tablet (81 mg total) by mouth every morning.  30 tablet  12  . ezetimibe (ZETIA) 10 MG tablet Take 10 mg by mouth every morning.      . Omega-3 Fatty Acids (FISH OIL PO) Take 3 capsules by mouth daily.      Marland Kitchen OMEPRAZOLE PO Take 1 capsule by mouth every morning.       Allergies  Allergen Reactions  . Other Other (See Comments)    Statins "goes crazy"    History  Substance Use Topics  . Smoking status: Former Games developer  . Smokeless tobacco: Not on file  . Alcohol Use: Yes    History reviewed. No pertinent family history.   Objective:   Patient Vitals for the past 8 hrs:  BP Temp Temp src Pulse Resp SpO2 Height Weight  08/13/13 1258 150/74 mmHg 98.5 F (36.9 C) Oral 125 19 96 % 5\' 7"  (1.702 m) 76.204 kg (168 lb)         See MD Preop evaluation      Assessment:   1. Food impaction of the esophagus.  Plan:   We will go ahead with EGD and removal of food impaction. Patient has had this done before but I've discussed the procedure with him again including the risk of bleeding, perforation of the  esophagus etc.

## 2013-08-13 NOTE — Progress Notes (Signed)
ANTICOAGULATION CONSULT NOTE - Initial Consult  Pharmacy Consult for heparin Indication: atrial fibrillation  Allergies  Allergen Reactions  . Statins     Goes crazy    Patient Measurements: Height: 5\' 8"  (172.7 cm) Weight: 154 lb 1.6 oz (69.9 kg) IBW/kg (Calculated) : 68.4 Heparin Dosing Weight: 69.9  Vital Signs: Temp: 97.9 F (36.6 C) (09/08 2000) Temp src: Axillary (09/08 2000) BP: 141/84 mmHg (09/08 1819) Pulse Rate: 59 (09/08 1819)  Labs:  Recent Labs  08/13/13 1731  HGB 16.2  HCT 47.4  PLT 212  CREATININE 0.95    Estimated Creatinine Clearance: 63 ml/min (by C-G formula based on Cr of 0.95).   Medical History: Past Medical History  Diagnosis Date  . Esophageal stricture   . Hypertension   . Hyperlipemia   . GERD (gastroesophageal reflux disease)   . Dementia     Assessment: Jeremy Briggs is a 79 y.o. male without prior cardiac history or strokes, who underwent EGD early this afternoon for presumed food impaction in the esophagus. Following the procedure, patient was noted to be in atrial flutter with RVR ranging from 100s-150s. Cardiology has been consulted for management of the atrial flutter and Pharmacy has been consulted to dose heparin prior to TEE cardioversion without a bolus.  Patient received enoxaparin 40mg  SQ x1 this evening at 2128  Will hold off on starting heparin until some enoxaparin has been cleared from the system  CHADS2VASC score is 4  Hbg nml at 16.2, plts ok at 212K  No bleeding post-procedure has been noted   Goal of Therapy:  Heparin level 0.3-0.7 units/ml Monitor platelets by anticoagulation protocol: Yes   Plan:  - initiate heparin gtt at 1000 units/hr starting at midnight - 8 hour heparin level - daily heparin level and CBC - monitor for bleeding - follow-up plans for oral anti-coagulation  Thank you for the consult.  Tomi Bamberger, PharmD Clinical Pharmacist Pager: [phone] Pharmacy:  [phone] 08/13/2013 9:58 PM

## 2013-08-13 NOTE — Progress Notes (Signed)
Following procedure patient was observed to be a new onset AFib/a flutter with ventricular rate 128 to 130. He has never had cardiac problems and has no cardiologist. I have [facility] and they will arrange to have him admitted to telemetry bed.chest x-ray, EKG, and labs for orders.

## 2013-08-13 NOTE — Consult Note (Signed)
Cardiology Consult Note  Admit date: 08/13/2013 Name: Jeremy Briggs 79 y.o.  male DOB:  Jul 27, 1934 MRN:  [id number]  Today's date:  08/13/2013  Referring Physician:   Dr. Waymon Amato  Primary Physician:   Dr. Leonides Sake  Reason for Consultation:   Atrial flutter  IMPRESSIONS: 1. Atrial flutter of undetermined age of onset. The rate is currently controlled. His CHADS2VASC score is 4. 2. Hypertension controlled 3. Dementia 4. COPD with prior history of smoking 5. Atherosclerosis as evidenced by coronary and vascular calcifications on imaging 6. Recent esophageal stricture with food impaction and duodenal ulcers  RECOMMENDATION: 1. I could not tell from the chart whether the atrial flutter was new onset or whether he was monitored during the procedure on telemetry and the time of onset of atrial flutter was noted. I will ask Dr. Randa Evens about that in the morning. If the atrial fibrillation is greater than 24-48 hours, then he would need anticoagulation prior to attempts at cardioversion or consider a TEE cardioversion. With a recent esophageal stricture and dilation it may be best to delay that. 2. Obtain thyroid function testing 3. Check echocardiogram to evaluate left and right atrial size 4. He will need to be anticoagulated and will review this with the gastroenterologist in the morning to be sure it is safe to start a novel oral anticoagulant.  HISTORY: This 79 year old male has a history of hypertension as well as some mild dementia but still functions at home with his family. He has had several esophageal strictures in the past and had some difficulty swallowing and developed a significant food impaction that had to be disimpacted today followed by an esophageal dilation. According to the family has been somewhat fatigued over the past week and may have had some vague upper chest discomfort over the past week. Following his food impaction removal as well as dilation he was noted to be in  atrial flutter with variable response and has been placed on diltiazem. He is currently not having shortness of breath or chest discomfort. He has never had any previous history of cardiac issues although he has had vascular calcifications noted as far back as 2005. He denies PND, orthopnea or edema.  Past Medical History  Diagnosis Date  . Esophageal stricture   . Hypertension   . Hyperlipemia   . GERD (gastroesophageal reflux disease)   . Dementia       Past Surgical History  Procedure Laterality Date  . Esophageal stret    . Esophagogastroduodenoscopy  01/27/2012    Procedure: ESOPHAGOGASTRODUODENOSCOPY (EGD);  Surgeon: Petra Kuba, MD;  Location: [facility] ENDOSCOPY;  Service: Endoscopy;  Laterality: N/A;     Allergies:  Significant intolerance to statins.   Medications: Prior to Admission medications   Medication Sig Start Date End Date Taking? Authorizing Provider  aspirin 81 MG tablet Take 1 tablet (81 mg total) by mouth every morning. 01/27/12  Yes Petra Kuba, MD  citalopram (CELEXA) 40 MG tablet Take 40 mg by mouth daily.   Yes Historical Provider, MD  ezetimibe (ZETIA) 10 MG tablet Take 10 mg by mouth every morning.   Yes Historical Provider, MD  Memantine HCl ER (NAMENDA XR) 28 MG CP24 Take 1 capsule by mouth daily.   Yes Historical Provider, MD  Omega-3 Fatty Acids (FISH OIL PO) Take 3 capsules by mouth daily.   Yes Historical Provider, MD  omeprazole (PRILOSEC) 20 MG capsule Take 20 mg by mouth daily.   Yes Historical Provider, MD  Red  Yeast Rice 600 MG CAPS Take 1 capsule by mouth daily.   Yes Historical Provider, MD  valsartan (DIOVAN) 80 MG tablet Take 80 mg by mouth daily.   Yes Historical Provider, MD    Family History: Family Status  Relation Status Death Age  . Mother Deceased   . Father Deceased     Social History:   reports that he has quit smoking. He does not have any smokeless tobacco history on file. He reports that  drinks alcohol. He reports that he  does not use illicit drugs. smoked 3 packs of cigarettes per day for years and has probably close to a 75-80-pack-year history of smoking. Drinks occasional wine.   History   Social History Narrative  . No narrative on file    Review of Systems: Family notes some fatigue. The onset of dementia has been over the past 4 months. Other than as noted above the remainder of the review of systems is unremarkable.  Physical Exam: BP 141/84  Pulse 59  Temp(Src) 97.9 F (36.6 C) (Axillary)  Resp 23  Ht 5\' 8"  (1.727 m)  Wt 69.9 kg (154 lb 1.6 oz)  BMI 23.44 kg/m2  SpO2 96%  General appearance: Pleasant mildly obese white male currently in no acute distress able to give a coherent history but not oriented to date Head: Normocephalic, without obvious abnormality, atraumatic Eyes: conjunctivae/corneas clear. PERRL, EOM's intact. Fundi benign. Neck: no adenopathy, no carotid bruit, no JVD and supple, symmetrical, trachea midline Lungs: clear to auscultation bilaterally Heart: Slightly irregular rhythm, normal S1-S2, no S3 or murmur Abdomen: soft, non-tender; bowel sounds normal; no masses,  no organomegaly Rectal: deferred Extremities: extremities normal, atraumatic, no cyanosis or edema Pulses: 2+ and symmetric Skin: Scattered sebaceous cysts noted Neurologic: Not oriented to date but knows he is in the hospital, able to give a reasonable history and carry on a good conversation, moves all 4 extremities  Labs: CBC  Recent Labs  08/13/13 1731  WBC 9.9  RBC 5.00  HGB 16.2  HCT 47.4  PLT 212  MCV 94.8  MCH 32.4  MCHC 34.2  RDW 12.7  LYMPHSABS 2.2  MONOABS 1.0  EOSABS 0.1  BASOSABS 0.1   CMP   Recent Labs  08/13/13 1731  NA 142  K 4.1  CL 106  CO2 25  GLUCOSE 104*  BUN 24*  CREATININE 0.95  CALCIUM 9.3  PROT 7.1  ALBUMIN 3.8  AST 17  ALT 12  ALKPHOS 51  BILITOT 0.7  GFRNONAA 78*  GFRAA >90    Radiology: Relatively clear lungs, atherosclerotic  calcifications noted of aorta, a previous CT scan from 2005 showed calcifications in the great vessels as well as the coronary arteries.  EKG: Atrial flutter with variable response  Signed:  W. Ashley Royalty MD [facility]   Cardiology Consultant  08/13/2013, 9:29 PM

## 2013-08-13 NOTE — Op Note (Signed)
[facility] [address] [place], [address]   ENDOSCOPY PROCEDURE REPORT  PATIENT: Jeremy, Briggs  MR#: [id number] BIRTHDATE: 29-Jul-1934 , 79  yrs. old GENDER: Male ENDOSCOPIST:Carlosdaniel Grob Randa Evens, MD REFERRED BY: Dr. Leonides Sake PROCEDURE DATE:  08/13/2013 PROCEDURE: EGD With the Removal of Food Impaction ASA CLASS:   class 2 INDICATIONS:   patient with history of chronic reflux with prior strictures requiring dilation with recent dysphagia. Had barium swallow the day showing obstructive esophagus. She's been unable to swallow since yesterday after eating a chicken dinner. MEDICATION:  fentanyl 75 mcg, versed 7 mg IV  glucagon .5 mg IV TOPICAL ANESTHETIC:    cetacaine spray  DESCRIPTION OF PROCEDURE:   The Pentax adult endoscope was inserted into the esophagus blindly with swallowing. The patient had a barium swallow today and was spitting up barium. For this reason a [place] suction apparatus was kept in the parents at all times. The esophagus was full of liquid barium and required some time to irrigate and suction. We saw a bolus of chicken in the esophagus. the net retrieval apparatus was placed to the scope was never deployed. We continued to suck and push in the chicken and PUSHED through into the stomach. A complete endoscopy was performed in the patient was seen to have several small duodenal ulcers that were not actively bleeding. at the stomach was examined. No gross lesions were seen. There was barium obscuring the esophagus and stomach. The scope is withdrawn back in the esophagus and the GE junction was markedly swollen and inflamed. There was probably stricture there could not even rule out neoplasm. There was no bleeding again barium was coding everything. Scope is withdrawn in the patient tolerated procedure fairly well. There were no immediate complications.    COMPLICATIONS: None  ENDOSCOPIC IMPRESSION: 1. Food Impaction of esophagus. This  was removed. The area was quite swollen and inflamed it was impossible to be sure that there was not a neoplasm in this area. It was slightly ulcerated and clearly was swollen but the scope did easily pass on multiple occasions. It was likely that there was stricture. Given the degree of inflammation, dilatation is not indicated at this time. 2. Duodenal ulcers. These were shallow and not bleeding and should be adequately treated with PPI therapy. !  RECOMMENDATIONS: 1. liquid diet for several hours advancing to soft foods tomorrow. 2. We'll continue PPI therapy BID. 3. We'll have patient see Dr. Bosie Clos in the office in 2 weeks. At that time, it is likely he will need a repeat EGD to rule out neoplasm at the junction and to perform dilatation.    _______________________________ Rosalie DoctorCarman Ching, MD 08/13/2013 3:08 PM  CC:  Dr Bosie Clos   PATIENT NAME:  Jeremy, Briggs MR#: [id number]

## 2013-08-13 NOTE — H&P (Addendum)
[facility] History and Physical  Jeremy Briggs OZH:[id number] DOB: [birth year] DOA: 08/13/2013  Referring physician: Dr. Carman Ching, GI PCP: Johny Blamer, MD  Outpatient Specialists:  1. GI: Dr. Charlott Rakes  Chief Complaint: Palpitations  HPI: Jeremy Briggs is a 79 y.o. male with PMH of esophageal stricture, prior dilatations, HTN, HL, GERD, dementia, former smoker, OSA non compliant with CPAP, no prior cardiac history or strokes, underwent EGD early this afternoon for presumed food impaction in the esophagus. Following the procedure, patient was noted to be in atrial flutter with RVR ranging from 100s-150s. Hospitalist admission was requested. Patient is unable to provide much history secondary to dementia. History obtained from patient's spouse at the bedside. Approximately 10 days ago, patient initially noted difficulty swallowing when he tried to take a pill. Since then, this got progressively worse and he started having episodes of regurgitation or even vomiting with oral intake. His appetite has generally been poor for last couple of months but worsened in the last 10 days. Over the last 2-3 months, patient has lost approximately 8 pounds weight. Emesis did not contain blood or coffee grounds. No associated fever, chills, dyspnea, chest pain, abdominal pain, constipation or diarrhea. Patient did complain of intermittent palpitations which seem to have been going on for an undetermined but >10 days duration. He also has long-standing (>6 months) history of dizziness but no syncopal episodes.   Review of Systems: All systems reviewed and apart from history of presenting illness, are negative   Past Medical History  Diagnosis Date  . Esophageal stricture   . Hypertension   . Hyperlipemia   . GERD (gastroesophageal reflux disease)   . Dementia    Past Surgical History  Procedure Laterality Date  . Esophageal stret    . Esophagogastroduodenoscopy  01/27/2012     Procedure: ESOPHAGOGASTRODUODENOSCOPY (EGD);  Surgeon: Petra Kuba, MD;  Location: [facility] ENDOSCOPY;  Service: Endoscopy;  Laterality: N/A;   Social History:  reports that he has quit smoking. He does not have any smokeless tobacco history on file. He reports that  drinks alcohol. He reports that he does not use illicit drugs. Married. Lives with spouse and independent of activities of daily living. However family closely monitors him due to his memory impairment, confusion and intermittent agitation.  Allergies  Allergen Reactions  . Other Other (See Comments)    Statins "goes crazy"    Family History  Problem Relation Age of Onset  . Alzheimer's disease Father     Prior to Admission medications   Medication Sig Start Date End Date Taking? Authorizing Provider  aspirin 81 MG tablet Take 1 tablet (81 mg total) by mouth every morning. 01/27/12  Yes Petra Kuba, MD  ezetimibe (ZETIA) 10 MG tablet Take 10 mg by mouth every morning.   Yes Historical Provider, MD  Omega-3 Fatty Acids (FISH OIL PO) Take 3 capsules by mouth daily.   Yes Historical Provider, MD  OMEPRAZOLE PO Take 1 capsule by mouth every morning.   Yes Historical Provider, MD   Physical Exam: Filed Vitals:   08/13/13 1610 08/13/13 1620 08/13/13 1630 08/13/13 1640  BP: 150/68 141/80 109/82 134/70  Pulse:      Temp:      TempSrc:      Resp: 16 11 17 25   Height:      Weight:      SpO2: 96% 98% 93% 98%   Patient was seen in endoscopy suite.  General exam: Moderately built and nourished  male patient, lying comfortably supine on the gurney in no obvious distress.  Head, eyes and ENT: Nontraumatic and normocephalic. Pupils equally reacting to light and accommodation. Oral mucosa moist.  Neck: Supple. No JVD, carotid bruit or thyromegaly.  Lymphatics: No lymphadenopathy.  Respiratory system: Clear to auscultation. No increased work of breathing.  Cardiovascular system: S1 and S2 heard, irregularly irregular and  tachycardic. No JVD, murmurs, gallops, clicks or pedal edema. Telemetry shows atrial flutter with ventricular rate fluctuating between 100-150s.  Gastrointestinal system: Abdomen is nondistended, soft and nontender. Normal bowel sounds heard. No organomegaly or masses appreciated.  Central nervous system: Alert and oriented. No focal neurological deficits.  Extremities: Symmetric 5 x 5 power. Peripheral pulses symmetrically felt.  Skin: No rashes or acute findings.  Musculoskeletal system: Negative exam.  Psychiatry: Pleasant and cooperative.   Labs on Admission: No labs available on admission and have been requested  Radiological Exams on Admission: Dg Esophagus  08/13/2013   *RADIOLOGY REPORT*  Clinical Data:Pain with sensation of impacted esophageal foreign body since yesterday.  Unable to keep anything down.  ESOPHAGUS/BARIUM SWALLOW/TABLET STUDY  Fluoroscopy Time: 0 minutes 30 seconds.  Comparison: None.  Findings: The patient drank thick barium.  On the AP view, swallowing mechanism is unremarkable.  The patient was turned in the right lateral position and a filling defect is seen in the distal esophagus.  Contrast would not pass beyond the distal esophagus.  Examination was terminated.  IMPRESSION: Impacted foreign body in the distal esophagus with resulting obstruction. Critical Value/emergent results were called by telephone at the time of interpretation on 08/13/2013 at 1200 hours to Dr. Evette Cristal, who verbally acknowledged these results.   Original Report Authenticated By: Leanna Battles, M.D.   Dg Chest Port 1 View  08/13/2013   *RADIOLOGY REPORT*  Clinical Data:  post endoscopy for impaction in the distal esophagus, atrial fibrillation.  PORTABLE CHEST - 1 VIEW  Comparison: CT chest 03/24/2004  Findings: Cardiomediastinal silhouette is unremarkable.  No acute infiltrate or pleural effusion.  No pulmonary edema. Atherosclerotic calcifications of thoracic aorta.  No diagnostic  pneumothorax.  IMPRESSION: No active disease.  No diagnostic pneumothorax.  Atherosclerotic calcifications of thoracic aorta.   Original Report Authenticated By: Natasha Mead, M.D.    EKG: to be done.  Assessment/Plan Principal Problem:   Atrial flutter with rapid ventricular response Active Problems:   Hypertension   Hyperlipemia   GERD (gastroesophageal reflux disease)   Dementia   Multiple duodenal ulcers   Food impaction of esophagus, S/P EGD 9/8   Atrial flutter with RVR/PAF - May have been precipitated by esophageal food impaction and EGD today. - Based on history, it appears as though he may have had this for an undetermined time. - Hemodynamically stable. - Admitted to step down.  - Followup outstanding labs: EKG, CBC, CMP, troponin. Ordered TSH and 2-D echo. - Brief IV fluid hydration. - Since patient is hemodynamically stable, asymptomatic and only mild RVR, we'll try a dose of Cardizem 10 mg IV x1 dose followed by oral Cardizem 30 mg every 6 hours and when necessary IV metoprolol and monitor closely. - CHADS 2 score: 2. For now continue continue ASA but consider anticoagulation after discussing safety, risk & benefits with spouse. [facility] Cardiology consulted.  Status post esophageal food disimpaction/duodenal ulcers/GERD - Underwent EGD on 9/8. - As per GI, clear fluid today and continue twice a day PPI. - OP followup with GI in 2 weeks and may need repeat EGD to rule  out neoplasm at GE junction and to perform dilatation - As per discussion with Dr. Randa Evens, okay to start anticoagulation  Hypertension - Controlled. - Not on medications at home.  Hyperlipidemia - Continue home medications.  Dementia - Stable.  OSA - non compliant with CPAP     Code Status: Full  Family Communication: Discussed with spouse  Disposition Plan: Home when medically stable   Time spent: 45 minutes.  [facility] Pager [phone]  If 7PM-7AM, please  contact night-coverage [email or web address] Password [facility] 08/13/2013, 5:14 PM

## 2013-08-14 ENCOUNTER — Encounter (HOSPITAL_COMMUNITY): Payer: Self-pay | Admitting: Gastroenterology

## 2013-08-14 DIAGNOSIS — F0390 Unspecified dementia without behavioral disturbance: Secondary | ICD-10-CM

## 2013-08-14 DIAGNOSIS — Z5189 Encounter for other specified aftercare: Secondary | ICD-10-CM

## 2013-08-14 LAB — CBC
HCT: 42.3 % (ref 39.0–52.0)
MCHC: 34 g/dL (ref 30.0–36.0)
MCV: 94 fL (ref 78.0–100.0)
RDW: 12.4 % (ref 11.5–15.5)
WBC: 7.4 10*3/uL (ref 4.0–10.5)

## 2013-08-14 MED ORDER — OMEPRAZOLE 20 MG PO CPDR: 20.0000 mg | DELAYED_RELEASE_CAPSULE | Freq: Two times a day (BID) | ORAL | Status: DC

## 2013-08-14 MED ORDER — DILTIAZEM HCL ER COATED BEADS 120 MG PO CP24: 120.0000 mg | ORAL_CAPSULE | Freq: Every day | ORAL | Status: DC

## 2013-08-14 MED ORDER — INFLUENZA VIRUS VACC SPLIT PF IM SUSP: 0.5000 mL | INTRAMUSCULAR | Status: DC

## 2013-08-14 MED ORDER — APIXABAN 5 MG PO TABS
5.0000 mg | ORAL_TABLET | Freq: Two times a day (BID) | ORAL | Status: DC
  Administered 2013-08-14: 5 mg via ORAL
  Filled 2013-08-14 (×2): qty 1

## 2013-08-14 MED ORDER — DILTIAZEM 12 MG/ML ORAL SUSPENSION
30.0000 mg | Freq: Four times a day (QID) | ORAL | Status: DC
  Filled 2013-08-14 (×2): qty 3

## 2013-08-14 MED ORDER — APIXABAN 5 MG PO TABS: 5.0000 mg | ORAL_TABLET | Freq: Two times a day (BID) | ORAL | Status: AC

## 2013-08-14 MED ORDER — DILTIAZEM HCL 30 MG PO TABS
30.0000 mg | ORAL_TABLET | Freq: Four times a day (QID) | ORAL | Status: DC
  Administered 2013-08-14 (×3): 30 mg via ORAL
  Filled 2013-08-14 (×6): qty 1

## 2013-08-14 NOTE — Progress Notes (Signed)
[id number] Earlene Plater, RN, BSN, CCM [phone] Chart Reviewed for discharge and hospital needs. Discharge needs at time of review:  None Review of patient progress due on [id number].

## 2013-08-14 NOTE — Plan of Care (Cosign Needed)
RN called concerned over pt ability to swallow tablets given recent esophageal edema seen on EGD. Tolerating clears. Will change short acting cardizem to suspension to ensure oral tolerance  Junious Silk, ANP

## 2013-08-14 NOTE — Progress Notes (Signed)
Subjective:  Feeling better but still inatrial flutter with controlled response this am.  Spoke with Dr. Randa Evens who mentioned thought it appeared that he was in atrial flutter when endo begun but difficult to tell for sure (rate was fast). ALso he mentioned OK for anticoagulation.  Objective:  Vital Signs in the last 24 hours: BP 136/65  Pulse 84  Temp(Src) 97.9 F (36.6 C) (Oral)  Resp 19  Ht 5\' 8"  (1.727 m)  Wt 69.9 kg (154 lb 1.6 oz)  BMI 23.44 kg/m2  SpO2 97%  Physical Exam: Pleasant WM in NAD Lungs:  Clear  Cardiac:  irregular rhythm, normal S1 and S2, no S3 Extremities:  No edema present  Intake/Output from previous day: 09/08 0701 - 09/09 0700 In: 1415 [P.O.:870; I.V.:545] Out: 160 [Urine:160] Weight Filed Weights   08/13/13 1258 08/13/13 1645  Weight: 76.204 kg (168 lb) 69.9 kg (154 lb 1.6 oz)    Lab Results: Basic Metabolic Panel:  Recent Labs  16/10/96 1731  NA 142  K 4.1  CL 106  CO2 25  GLUCOSE 104*  BUN 24*  CREATININE 0.95    CBC:  Recent Labs  08/13/13 1731 08/14/13 0341  WBC 9.9 7.4  NEUTROABS 6.7  --   HGB 16.2 14.4  HCT 47.4 42.3  MCV 94.8 94.0  PLT 212 198    Telemetry: Continued atrial flutter with controlled response  Assessment/Plan:  1. Atrial flutter of undetermined age (present on arrival to endo according to Dr. Randa Evens) 2. Recent esophageal food impaction 3. Hypertension  Rec:  I would begin Eliquus 5 mg BID (OK from Dr. Randa Evens viewpoint).   Continue diltiazem for rate control ECHO today Could go home after tests complete as we are not going to do cardioversion this admission because unclear how long this has been present and with recent esophageal issues not great for TEE assisted cardioversion. I would need to see back in one week in my office.   Darden Palmer  MD [facility]  08/14/2013, 8:39 AM

## 2013-08-14 NOTE — Progress Notes (Signed)
PT Cancellation Note  Patient Details Name: Jeremy Briggs MRN: [id number] DOB: 22-Mar-1934   Cancelled Treatment:    Reason Eval/Treat Not Completed: PT screened, no needs identified, will sign off   Pt has walked the perimeter of the hallway with nursing with no difficulty Wife and daughter feel they will be able to take care of pt at home   Donnetta Hail 08/14/2013, 1:43 PM

## 2013-08-14 NOTE — Progress Notes (Signed)
[facility] PROGRESS NOTE Subjective patient without complaints. No chest pain or cough after removal of food impaction yesterday. Has been seen by cardiology and is on Cardizem drip with good response.  Objective: Vital signs in last 24 hours: Temp:  [97.9 F (36.6 C)-98.8 F (37.1 C)] 98.6 F (37 C) (09/09 0830) Pulse Rate:  [48-170] 83 (09/09 0838) Resp:  [10-25] 20 (09/09 0833) BP: (42-172)/(31-117) 128/86 mmHg (09/09 0838) SpO2:  [85 %-99 %] 98 % (09/09 0838) Weight:  [69.9 kg (154 lb 1.6 oz)-76.204 kg (168 lb)] 69.9 kg (154 lb 1.6 oz) (09/08 1645)    Intake/Output from previous day: 09/08 0701 - 09/09 0700 In: 1415 [P.O.:870; I.V.:545] Out: 160 [Urine:160] Intake/Output this shift: Total I/O In: 295 [P.O.:125; I.V.:170] Out: -   PE: General-- Heart-- Lungs--clear Abdomen-- soft and nontender  Lab Results:  Recent Labs  08/13/13 1731 08/14/13 0341  WBC 9.9 7.4  HGB 16.2 14.4  HCT 47.4 42.3  PLT 212 198   BMET  Recent Labs  08/13/13 1731  NA 142  K 4.1  CL 106  CO2 25  CREATININE 0.95   LFT  Recent Labs  08/13/13 1731  PROT 7.1  AST 17  ALT 12  ALKPHOS 51  BILITOT 0.7   PT/INR No results found for this basename: LABPROT, INR,  in the last 72 hours PANCREAS No results found for this basename: LIPASE,  in the last 72 hours       Studies/Results: Dg Esophagus  08/13/2013   *RADIOLOGY REPORT*  Clinical Data:Pain with sensation of impacted esophageal foreign body since yesterday.  Unable to keep anything down.  ESOPHAGUS/BARIUM SWALLOW/TABLET STUDY  Fluoroscopy Time: 0 minutes 30 seconds.  Comparison: None.  Findings: The patient drank thick barium.  On the AP view, swallowing mechanism is unremarkable.  The patient was turned in the right lateral position and a filling defect is seen in the distal esophagus.  Contrast would not pass beyond the distal esophagus.  Examination was terminated.  IMPRESSION: Impacted foreign body in the  distal esophagus with resulting obstruction. Critical Value/emergent results were called by telephone at the time of interpretation on 08/13/2013 at 1200 hours to Dr. Evette Cristal, who verbally acknowledged these results.   Original Report Authenticated By: Leanna Battles, M.D.   Dg Chest Port 1 View  08/13/2013   *RADIOLOGY REPORT*  Clinical Data:  post endoscopy for impaction in the distal esophagus, atrial fibrillation.  PORTABLE CHEST - 1 VIEW  Comparison: CT chest 03/24/2004  Findings: Cardiomediastinal silhouette is unremarkable.  No acute infiltrate or pleural effusion.  No pulmonary edema. Atherosclerotic calcifications of thoracic aorta.  No diagnostic pneumothorax.  IMPRESSION: No active disease.  No diagnostic pneumothorax.  Atherosclerotic calcifications of thoracic aorta.   Original Report Authenticated By: Natasha Mead, M.D.    Medications: I have reviewed the patient's current medications.  Assessment/Plan: 1. Food Impaction. Successfully remove no evidence for aspiration pneumonia or perforation at this time. 2. Esophageal stricture. This will need to be dilated and 3 to 4 weeks after inflammation In the esophagus is improved.discussed with the wife and she will increase PPI to be BID  3. AFib/flutter. On Cardizem drip with good response. Feel that he could be safely anticoagulated since the only finding on EGD was esophagitis and inflammation due to the food impaction and small duodenal ulcers.    Recommendations: should be okay to discharge in anticoagulation with PPI BID. Discussed the need for mechanically soft diet and BID PPI with wife.  They will see Dr. Bosie Clos back in the office in 3 to 4 weeks to arrange elective dilatation.   Kaileena Obi JR,Phyllip Claw L 08/14/2013, 9:14 AM

## 2013-08-14 NOTE — Discharge Summary (Signed)
Physician Discharge Summary  Jeremy Briggs ZDG:[id number] DOB: 1934-11-03 DOA: 08/13/2013  PCP: Johny Blamer, MD  Admit date: 08/13/2013 Discharge date: 08/14/2013  Time spent: Less than 30 minutes  Recommendations for Outpatient Follow-up:  1. Dr. Charlott Rakes, GI. To be seen in 3-4 weeks for repeat EGD. 2. Dr. Johny Blamer, PCP in 1 week 3. Dr. Vilinda Blanks. Donnie Aho, Cardiology: Call for appointment. 4. 2 D Echo results will be followed by Dr. Donnie Aho.  Discharge Diagnoses:  Principal Problem:   Atrial flutter with rapid ventricular response Active Problems:   Hypertension   Hyperlipemia   GERD (gastroesophageal reflux disease)   Dementia   Multiple duodenal ulcers   Food impaction of esophagus, S/P EGD 9/8   Discharge Condition: Improved & Stable  Diet recommendation: Mechanical soft diet.  Filed Weights   08/13/13 1258 08/13/13 1645  Weight: 76.204 kg (168 lb) 69.9 kg (154 lb 1.6 oz)    History of present illness:  79 y.o. male with PMH of esophageal stricture, prior dilatations, HTN, HL, GERD, dementia, former smoker, OSA non compliant with CPAP, no prior cardiac history or strokes, underwent EGD on 08/13/13 for presumed food impaction in the esophagus. Following the procedure, patient was noted to be in atrial flutter with RVR ranging from 100s-150s. Hospitalist admission was requested. Patient did complain of intermittent palpitations which seem to have been going on for an undetermined but >10 days duration. He also has long-standing (>6 months) history of dizziness but no syncopal episodes  Hospital Course:   Atrial flutter with RVR - This may have been precipitated by esophageal food impaction and EGD for same. - Duration of this is undetermined, but seems to have had it intermittently for at least one-2 weeks. Patient poor historian secondary to dementia. - He was admitted to step down unit and started on oral short-acting Cardizem and IV heparin infusion. -  CHADS2VASC score: 4 - Cardiology consulted and followed again today. - Discussed with Dr. Donnie Aho recommends DC on Cardizem CD 120 mg daily, Apixaban 5 mg twice a day. He will follow 2-D echo results as OP. No plans for cardioversion this admission because it's unclear how long his atrial flutter has been present and with recent esophageal issues, not great for TEE assisted cardioversion. Outpatient followup with cardiology in one week. - Aspirin discontinued. - Rate controlled on above regimen.  Esophageal food impaction/duodenal ulcers/esophageal stricture - Patient underwent successful removal of food impaction by EGD on 08/13/13. He was started on clear liquids and is advised mechanical soft diet on discharge. - The duodenal ulcers were not bleeding. GI recommends twice a day PPI. - GI recommends a repeat EGD for dilatation of esophageal stricture in 3-4 weeks after the inflammation in the esophagus has improved.  Hypertension - Mildly uncontrolled - Continue home ARB and new Cardizem CD - Monitor as outpatient.  Hyperlipidemia - Continue home medications.  Dementia - Mental status at baseline. Spouse states that he will be provided 24/7 supervision. - He does have a history of intermittent falls but not significant enough to preclude anticoagulation at this time as per cardiology.  OSA - Noncompliant with CPAP.   Consultations:  Gastroenterology  Cardiology  Procedures:  None    Discharge Exam:  Complaints:  Patient denies complaints. No palpitations, chest pain or dyspnea.  Filed Vitals:   08/14/13 0830 08/14/13 0833 08/14/13 0838 08/14/13 1200  BP: 145/82 113/87 128/86 155/92  Pulse: 74 77 83   Temp: 98.6 F (37 C)  TempSrc: Oral     Resp: 15 20    Height:      Weight:      SpO2: 98% 98% 98%     General exam: Moderately built and nourished male patient lying comfortably in bed. Respiratory system: Clear. No increased work of breathing. Cardiovascular  system: S1 & S2 heard, irregularly irregular. No JVD, murmurs, gallops, clicks or pedal edema. Telemetry: Atrial flutter with controlled ventricular response (4:1 block) Gastrointestinal system: Abdomen is nondistended, soft and nontender. Normal bowel sounds heard. Central nervous system: Alert and oriented. No focal neurological deficits. Extremities: Symmetric 5 x 5 power.  Discharge Instructions      Discharge Orders   Future Orders Complete By Expires   Call MD for:  difficulty breathing, headache or visual disturbances  As directed    Call MD for:  extreme fatigue  As directed    Call MD for:  persistant dizziness or light-headedness  As directed    Call MD for:  persistant nausea and vomiting  As directed    Call MD for:  As directed    Comments:     Palpitations, difficulty swallowing.   Discharge instructions  As directed    Comments:     Diet: Mechanical soft diet.   Increase activity slowly  As directed        Medication List    STOP taking these medications       aspirin 81 MG tablet      TAKE these medications       apixaban 5 MG Tabs tablet  Commonly known as:  ELIQUIS  Take 1 tablet (5 mg total) by mouth 2 (two) times daily.     citalopram 40 MG tablet  Commonly known as:  CELEXA  Take 40 mg by mouth daily.     diltiazem 120 MG 24 hr capsule  Commonly known as:  CARDIZEM CD  Take 1 capsule (120 mg total) by mouth daily at 6 PM.     ezetimibe 10 MG tablet  Commonly known as:  ZETIA  Take 10 mg by mouth every morning.     FISH OIL PO  Take 3 capsules by mouth daily.     NAMENDA XR 28 MG Cp24  Generic drug:  Memantine HCl ER  Take 1 capsule by mouth daily.     omeprazole 20 MG capsule  Commonly known as:  PRILOSEC  Take 1 capsule (20 mg total) by mouth 2 (two) times daily before a meal.     Red Yeast Rice 600 MG Caps  Take 1 capsule by mouth daily.     valsartan 80 MG tablet  Commonly known as:  DIOVAN  Take 80 mg by mouth daily.        Follow-up Information   Follow up with SCHOOLER,VINCENT C., MD. Call in 1 week.   Specialty:  Gastroenterology   Contact information:   [address] [place], [address] Jaynie [place] [address] [phone]       Follow up with Johny Blamer, MD. Schedule an appointment as soon as possible for a visit in 1 week.   Specialty:  Family Medicine   Contact information:   [facility] [address] [place] [address] [phone]       Schedule an appointment as soon as possible for a visit with Darden Palmer, MD.   Specialty:  Cardiology   Contact information:   [address] [place]  [address] [place] [address] [phone]        The results of significant diagnostics from this hospitalization (including imaging, microbiology, ancillary and laboratory) are listed below for reference.    Significant Diagnostic Studies: Dg Esophagus  08/13/2013   *RADIOLOGY REPORT*  Clinical Data:Pain with sensation of impacted esophageal foreign body since yesterday.  Unable to keep anything down.  ESOPHAGUS/BARIUM SWALLOW/TABLET STUDY  Fluoroscopy Time: 0 minutes 30 seconds.  Comparison: None.  Findings: The patient drank thick barium.  On the AP view, swallowing mechanism is unremarkable.  The patient was turned in the right lateral position and a filling defect is seen in the distal esophagus.  Contrast would not pass beyond the distal esophagus.  Examination was terminated.  IMPRESSION: Impacted foreign body in the distal esophagus with resulting obstruction. Critical Value/emergent results were called by telephone at the time of interpretation on 08/13/2013 at 1200 hours to Dr. Evette Cristal, who verbally acknowledged these results.   Original Report Authenticated By: Leanna Battles, M.D.   Dg Chest Port 1 View  08/13/2013   *RADIOLOGY REPORT*  Clinical Data:  post endoscopy for impaction in the distal esophagus, atrial  fibrillation.  PORTABLE CHEST - 1 VIEW  Comparison: CT chest 03/24/2004  Findings: Cardiomediastinal silhouette is unremarkable.  No acute infiltrate or pleural effusion.  No pulmonary edema. Atherosclerotic calcifications of thoracic aorta.  No diagnostic pneumothorax.  IMPRESSION: No active disease.  No diagnostic pneumothorax.  Atherosclerotic calcifications of thoracic aorta.   Original Report Authenticated By: Natasha Mead, M.D.    Microbiology: Recent Results (from the past 240 hour(s))  MRSA PCR SCREENING     Status: None   Collection Time    08/13/13  6:02 PM      Result Value Range Status   MRSA by PCR NEGATIVE  NEGATIVE Final   Comment:            The GeneXpert MRSA Assay (FDA     approved for NASAL specimens     only), is one component of a     comprehensive MRSA colonization     surveillance program. It is not     intended to diagnose MRSA     infection nor to guide or     monitor treatment for     MRSA infections.     Labs: Basic Metabolic Panel:  Recent Labs Lab 08/13/13 1731  NA 142  K 4.1  CL 106  CO2 25  GLUCOSE 104*  BUN 24*  CREATININE 0.95  CALCIUM 9.3  MG 2.3   Liver Function Tests:  Recent Labs Lab 08/13/13 1731  AST 17  ALT 12  ALKPHOS 51  BILITOT 0.7  PROT 7.1  ALBUMIN 3.8   No results found for this basename: LIPASE, AMYLASE,  in the last 168 hours No results found for this basename: AMMONIA,  in the last 168 hours CBC:  Recent Labs Lab 08/13/13 1731 08/14/13 0341  WBC 9.9 7.4  NEUTROABS 6.7  --   HGB 16.2 14.4  HCT 47.4 42.3  MCV 94.8 94.0  PLT 212 198   Cardiac Enzymes:  Recent Labs Lab 08/13/13 2245 08/14/13 0341 08/14/13 0805  TROPONINI <0.30 <0.30 <0.30   BNP: BNP (last 3 results) No results found for this basename: PROBNP,  in the last 8760 hours CBG: No results found for this basename: GLUCAP,  in the last 168 hours  Additional labs: 1. TSH: 1.086 2. Hemoglobin A1c:  5.7   Signed:  Jackie Russman  [facility] 08/14/2013, 2:36 PM

## 2013-08-14 NOTE — Progress Notes (Signed)
Echocardiogram 2D Echocardiogram has been performed.  Jeremy Briggs 08/14/2013, 2:22 PM

## 2013-08-15 DIAGNOSIS — I7090 Unspecified atherosclerosis: Secondary | ICD-10-CM | POA: Insufficient documentation

## 2013-08-16 DIAGNOSIS — Z7901 Long term (current) use of anticoagulants: Secondary | ICD-10-CM | POA: Insufficient documentation

## 2013-09-10 ENCOUNTER — Encounter: Payer: Self-pay | Admitting: Cardiology

## 2013-09-10 ENCOUNTER — Other Ambulatory Visit: Payer: Self-pay | Admitting: Cardiology

## 2013-09-10 NOTE — H&P (Signed)
Jeremy Briggs  Date of visit:  09/10/2013 DOB:  10/29/1934    Age:  79 yrs. Medical record number:  [id number]     Account number:  [id number] Primary Care Provider: Nolene Ebbs ____________________________ CURRENT DIAGNOSES  1. Arrhythmia-Atrial Flutter  2. Long-term (current) use of anticoagulants  3. Hyperlipidemia  4. CAD,Native  5. Dementia  6. GERD  7. Atrial fibrillation ____________________________ ALLERGIES  Crestor, Confusion  Lipitor, Confusion  Simvastatin, Confusion ____________________________ MEDICATIONS  1. Eliquis 5 mg tablet, BID  2. citalopram 40 mg tablet, 1 p.o. daily  3. diltiazem HCl 120 mg tablet extended release 24 hr, 1 p.o. daily  4. Namenda XR 28 mg capsule,sprinkle,ER 24hr, 1 p.o. daily  5. valsartan 80 mg tablet, 1 p.o. daily  6. omeprazole 20 mg capsule,delayed release(DR/EC), BID ____________________________ CHIEF COMPLAINTS  Followup of Atrial flutter ____________________________ HISTORY OF PRESENT ILLNESS  Patient seen for preoperative evaluation for cardioversion. He was seen in the hospital one month ago when he developed atrial flutter that occurred after he had an esophageal dilation and removal of food impaction. At that time it was of indeterminate origin and there was a question because of some shortness of breath and chest pain whether it had been going on for some time. He has been anticoagulated for one month. He continues to have dyspnea on exertion particularly with going up stairs. He had an echocardiogram showing good LV function. He has had no recurrent chest pain since his esophageal dilation. He does have some dementia with mild memory loss. ____________________________ PAST HISTORY  Past Medical Illnesses:  hypertension, hyperlipidemia, GERD, dementia, sleep apnea;  Cardiovascular Illnesses:  atrial flutter, CAD;  Surgical Procedures:  melanoma rt ear;  Cardiology Procedures-Invasive:  no history of prior cardiac  procedures;  Cardiology Procedures-Noninvasive:  echocardiogram September 2014;  LVEF of 60% documented via echocardiogram on 08/14/2013,   ____________________________ CARDIO-PULMONARY TEST DATES EKG Date:  09/10/2013;  Echocardiography Date: 08/14/2013;  Chest Xray Date: 08/13/2013;   ____________________________ FAMILY HISTORY Brother -- Unknown Disease, Brother dead Brother -- Cancer, Brother dead Brother -- Brother alive and well Brother -- Alcoholism, Brother dead Brother -- Brother alive and well Father -- Dementia/Alzheimer's, Father dead Mother -- Senile dementia, Mother dead Sister -- Sister alive and well Sister -- Sister alive and well Sister -- Senile dementia ____________________________ SOCIAL HISTORY Alcohol Use:  wine 1-2 per day;  Smoking:  used to smoke but quit 2000;  Diet:  regular diet and liquid diet;  Lifestyle:  married;  Exercise:  no regular exercise;  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 Eyes: denies diplopia, history of glaucoma or visual problems. Respiratory: mild dyspnea with exertion Cardiovascular:  please review HPI Abdominal: see HPI Genitourinary-Male: nocturia  Musculoskeletal:  arthritis of the thumb Neurological:  dizziness  ____________________________ PHYSICAL EXAMINATION VITAL SIGNS  Blood Pressure:  110/60 Sitting, Right arm, regular cuff  , 104/62 Standing, Right arm and regular cuff   Pulse:  76/min. Weight:  165.00 lbs. Height:  65"BMI: 27  Constitutional:  pleasant white male in no acute distress Skin:  warm and dry to touch, no apparent skin lesions, or masses noted. Head:  normocephalic, normal hair pattern, no masses or tenderness Eyes:  EOMS Intact, PERRLA, C and S clear, Funduscopic exam not done. ENT:  ears, nose and throat reveal no gross abnormalities.  Dentition good. Neck:  supple, without  massess. No JVD, thyromegaly or carotid bruits. Carotid upstroke normal. Chest:  normal symmetry, clear to auscultation and percussion. Cardiac:  regular rhythm, normal S1 and S2, No S3 or S4, no murmurs, gallops or rubs detected. Abdomen:  abdomen soft,non-tender, no masses, no hepatospenomegaly, or aneurysm noted Peripheral Pulses:  the femoral,dorsalis pedis, and posterior tibial pulses are full and equal bilaterally with no bruits auscultated. Extremities & Back:  no deformities, clubbing, cyanosis, erythema or edema observed. Normal muscle strength and tone. Neurological:  no gross motor or sensory deficits noted, affect appropriate, oriented x3. ____________________________ MOST RECENT LIPID PANEL 02/12/13  CHOL TOTL 255 mg/dl, LDL 960 calc, HDL 52 mg/dl and TRIGLYCER 454 mg/dl ____________________________ IMPRESSIONS/PLAN  1. Atrial flutter of undetermined origin 2. Long-term anticoagulation with Eliquus 3. Dementia 4. Hypertension  Recommendations:  Cardioversion discussed with the patient including risks of stroke, arrhythmia, death, or anesthesia risks. The patient understands and is willing to proceed. Plan to do on October 9. ____________________________ TODAYS ORDERS  1. 12 Lead EKG: Today  2. Comprehensive Metabolic Panel: Today  3. Complete Blood Count: Today  4. Electrical Cardioversion: First Available                       ____________________________ Cardiology Physician:  Darden Palmer MD [facility]

## 2013-09-13 ENCOUNTER — Encounter (HOSPITAL_COMMUNITY): Payer: Self-pay | Admitting: *Deleted

## 2013-09-13 ENCOUNTER — Encounter (HOSPITAL_COMMUNITY): Payer: Medicare Other | Admitting: Anesthesiology

## 2013-09-13 ENCOUNTER — Ambulatory Visit (HOSPITAL_COMMUNITY): Payer: Medicare Other | Admitting: Anesthesiology

## 2013-09-13 ENCOUNTER — Encounter (HOSPITAL_COMMUNITY)
Admission: RE | Disposition: A | Discharge status: Home / Self Care | Payer: Self-pay | Source: Ambulatory Visit | Attending: Cardiology

## 2013-09-13 ENCOUNTER — Ambulatory Visit (HOSPITAL_COMMUNITY)
Admission: RE | Admit: 2013-09-13 | Discharge: 2013-09-13 | Disposition: A | Discharge status: Home / Self Care | Payer: Medicare Other | Source: Ambulatory Visit | Attending: Cardiology | Admitting: Cardiology

## 2013-09-13 DIAGNOSIS — F0390 Unspecified dementia without behavioral disturbance: Secondary | ICD-10-CM | POA: Insufficient documentation

## 2013-09-13 DIAGNOSIS — I4892 Unspecified atrial flutter: Secondary | ICD-10-CM | POA: Insufficient documentation

## 2013-09-13 DIAGNOSIS — I10 Essential (primary) hypertension: Secondary | ICD-10-CM | POA: Insufficient documentation

## 2013-09-13 DIAGNOSIS — Z7901 Long term (current) use of anticoagulants: Secondary | ICD-10-CM | POA: Insufficient documentation

## 2013-09-13 HISTORY — DX: Unspecified osteoarthritis, unspecified site: M19.90

## 2013-09-13 HISTORY — PX: CARDIOVERSION: SHX1299

## 2013-09-13 SURGERY — CARDIOVERSION
Anesthesia: General | Wound class: Clean

## 2013-09-13 MED ORDER — SODIUM CHLORIDE 0.9 % IV SOLN
INTRAVENOUS | Status: DC | PRN
  Administered 2013-09-13: 11:00:00 via INTRAVENOUS

## 2013-09-13 MED ORDER — LIDOCAINE HCL (CARDIAC) 20 MG/ML IV SOLN
INTRAVENOUS | Status: DC | PRN
  Administered 2013-09-13: 40 mg via INTRAVENOUS

## 2013-09-13 MED ORDER — PROPOFOL 10 MG/ML IV BOLUS
INTRAVENOUS | Status: DC | PRN
  Administered 2013-09-13: 80 mg via INTRAVENOUS

## 2013-09-13 MED ORDER — SODIUM CHLORIDE 0.9 % IV SOLN
INTRAVENOUS | Status: DC
  Administered 2013-09-13: 10:00:00 via INTRAVENOUS

## 2013-09-13 NOTE — H&P (View-Only) (Signed)
 Jeremy Briggs, Jeremy Briggs  Date of visit:  09/10/2013 DOB:  11/12/1934    Age:  79 yrs. Medical record number:  [id number]     Account number:  [id number] Primary Care Provider: HARRIS, Mick Tanguma RANDALL ____________________________ CURRENT DIAGNOSES  1. Arrhythmia-Atrial Flutter  2. Long-term (current) use of anticoagulants  3. Hyperlipidemia  4. CAD,Native  5. Dementia  6. GERD  7. Atrial fibrillation ____________________________ ALLERGIES  Crestor, Confusion  Lipitor, Confusion  Simvastatin, Confusion ____________________________ MEDICATIONS  1. Eliquis 5 mg tablet, BID  2. citalopram 40 mg tablet, 1 p.o. daily  3. diltiazem HCl 120 mg tablet extended release 24 hr, 1 p.o. daily  4. Namenda XR 28 mg capsule,sprinkle,ER 24hr, 1 p.o. daily  5. valsartan 80 mg tablet, 1 p.o. daily  6. omeprazole 20 mg capsule,delayed release(DR/EC), BID ____________________________ CHIEF COMPLAINTS  Followup of Atrial flutter ____________________________ HISTORY OF PRESENT ILLNESS  Patient seen for preoperative evaluation for cardioversion. He was seen in the hospital one month ago when he developed atrial flutter that occurred after he had an esophageal dilation and removal of food impaction. At that time it was of indeterminate origin and there was a question because of some shortness of breath and chest pain whether it had been going on for some time. He has been anticoagulated for one month. He continues to have dyspnea on exertion particularly with going up stairs. He had an echocardiogram showing good LV function. He has had no recurrent chest pain since his esophageal dilation. He does have some dementia with mild memory loss. ____________________________ PAST HISTORY  Past Medical Illnesses:  hypertension, hyperlipidemia, GERD, dementia, sleep apnea;  Cardiovascular Illnesses:  atrial flutter, CAD;  Surgical Procedures:  melanoma rt ear;  Cardiology Procedures-Invasive:  no history of prior cardiac  procedures;  Cardiology Procedures-Noninvasive:  echocardiogram September 2014;  LVEF of 60% documented via echocardiogram on 08/14/2013,   ____________________________ CARDIO-PULMONARY TEST DATES EKG Date:  09/10/2013;  Echocardiography Date: 08/14/2013;  Chest Xray Date: 08/13/2013;   ____________________________ FAMILY HISTORY Brother -- Unknown Disease, Brother dead Brother -- Cancer, Brother dead Brother -- Brother alive and well Brother -- Alcoholism, Brother dead Brother -- Brother alive and well Father -- Dementia/Alzheimer's, Father dead Mother -- Senile dementia, Mother dead Sister -- Sister alive and well Sister -- Sister alive and well Sister -- Senile dementia ____________________________ SOCIAL HISTORY Alcohol Use:  wine 1-2 per day;  Smoking:  used to smoke but quit 2000;  Diet:  regular diet and liquid diet;  Lifestyle:  married;  Exercise:  no regular exercise;  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 Eyes: denies diplopia, history of glaucoma or visual problems. Respiratory: mild dyspnea with exertion Cardiovascular:  please review HPI Abdominal: see HPI Genitourinary-Male: nocturia  Musculoskeletal:  arthritis of the thumb Neurological:  dizziness  ____________________________ PHYSICAL EXAMINATION VITAL SIGNS  Blood Pressure:  110/60 Sitting, Right arm, regular cuff  , 104/62 Standing, Right arm and regular cuff   Pulse:  76/min. Weight:  165.00 lbs. Height:  65"BMI: 27  Constitutional:  pleasant white male in no acute distress Skin:  warm and dry to touch, no apparent skin lesions, or masses noted. Head:  normocephalic, normal hair pattern, no masses or tenderness Eyes:  EOMS Intact, PERRLA, C and S clear, Funduscopic exam not done. ENT:  ears, nose and throat reveal no gross abnormalities.  Dentition good. Neck:  supple, without    massess. No JVD, thyromegaly or carotid bruits. Carotid upstroke normal. Chest:  normal symmetry, clear to auscultation and percussion. Cardiac:  regular rhythm, normal S1 and S2, No S3 or S4, no murmurs, gallops or rubs detected. Abdomen:  abdomen soft,non-tender, no masses, no hepatospenomegaly, or aneurysm noted Peripheral Pulses:  the femoral,dorsalis pedis, and posterior tibial pulses are full and equal bilaterally with no bruits auscultated. Extremities & Back:  no deformities, clubbing, cyanosis, erythema or edema observed. Normal muscle strength and tone. Neurological:  no gross motor or sensory deficits noted, affect appropriate, oriented x3. ____________________________ MOST RECENT LIPID PANEL 02/12/13  CHOL TOTL 255 mg/dl, LDL 166 calc, HDL 52 mg/dl and TRIGLYCER 183 mg/dl ____________________________ IMPRESSIONS/PLAN  1. Atrial flutter of undetermined origin 2. Long-term anticoagulation with Eliquus 3. Dementia 4. Hypertension  Recommendations:  Cardioversion discussed with the patient including risks of stroke, arrhythmia, death, or anesthesia risks. The patient understands and is willing to proceed. Plan to do on October 9. ____________________________ TODAYS ORDERS  1. 12 Lead EKG: Today  2. Comprehensive Metabolic Panel: Today  3. Complete Blood Count: Today  4. Electrical Cardioversion: First Available                       ____________________________ Cardiology Physician:  W. Spencer Salina Stanfield, Jr. MD [facility]     

## 2013-09-13 NOTE — Interval H&P Note (Signed)
History and Physical Interval Note:  09/13/2013 11:04 AM  Jeremy Briggs  has presented today for surgery, with the diagnosis of A FIB  The various methods of treatment have been discussed with the patient and family. After consideration of risks, benefits and other options for treatment, the patient has consented to  Cardioversion  as a surgical intervention .  The patient's history has been reviewed, patient examined, no change in status, stable for surgery.  I have reviewed the patient's chart and labs.  Questions were answered to the patient's satisfaction.     Eshan Trupiano JR,W SPENCER

## 2013-09-13 NOTE — Anesthesia Preprocedure Evaluation (Signed)
Anesthesia Evaluation  Patient identified by MRN, date of birth, ID band  History of Anesthesia Complications Negative for: history of anesthetic complications  Airway Mallampati: I  Neck ROM: Full    Dental   Pulmonary neg pulmonary ROS,  breath sounds clear to auscultation        Cardiovascular hypertension, + dysrhythmias Atrial Fibrillation Rhythm:Irregular     Neuro/Psych Dementia history    GI/Hepatic GERD-  ,  Endo/Other    Renal/GU      Musculoskeletal   Abdominal   Peds  Hematology   Anesthesia Other Findings   Reproductive/Obstetrics                           Anesthesia Physical Anesthesia Plan  ASA: III  Anesthesia Plan: General   Post-op Pain Management:    Induction: Intravenous  Airway Management Planned: Mask  Additional Equipment:   Intra-op Plan:   Post-operative Plan:   Informed Consent: I have reviewed the patients History and Physical, chart, labs and discussed the procedure including the risks, benefits and alternatives for the proposed anesthesia with the patient or authorized representative who has indicated his/her understanding and acceptance.     Plan Discussed with: CRNA and Surgeon  Anesthesia Plan Comments:         Anesthesia Quick Evaluation

## 2013-09-13 NOTE — Transfer of Care (Signed)
Immediate Anesthesia Transfer of Care Note  Patient: Jeremy Briggs  Procedure(s) Performed: Procedure(s): CARDIOVERSION (N/A)  Patient Location: PACU  Anesthesia Type:General  Level of Consciousness: awake, alert  and patient cooperative  Airway & Oxygen Therapy: Patient Spontanous Breathing and Patient connected to nasal cannula oxygen  Post-op Assessment: Report given to PACU RN, Post -op Vital signs reviewed and stable and Patient moving all extremities  Post vital signs: Reviewed and stable  Complications: No apparent anesthesia complications

## 2013-09-13 NOTE — CV Procedure (Signed)
Electrical Cardioversion Procedure Note  SENDER RUEB   79 y.o. male MRN: [id number] DOB: 22-Nov-1934  Today's date: 09/13/2013  Procedure: Electrical Cardioversion  Indications:  Atrial Flutter  Time Out: Verified patient identification, verified procedure,medications/allergies/relevent history reviewed, required imaging and test results available.  Performed  Procedure Details  The patient was NPO after midnight. Anesthesia was administered at the beside  by [facility] with 80 mg of propofol.  Cardioversion was done with synchronized biphasic defibrillation with AP pads with 75 watts.  The patient converted to normal sinus rhythm. The patient tolerated the procedure well   IMPRESSION:  Successful cardioversion of atrial flutter   W. Viann Fish, Montez Hageman. MD [facility]   09/13/2013, 11:24 AM

## 2013-09-13 NOTE — Anesthesia Procedure Notes (Signed)
Procedure Name: MAC Date/Time: 09/13/2013 11:21 AM Performed by: Othella Boyer Pre-anesthesia Checklist: Patient identified, Emergency Drugs available, Suction available, Patient being monitored and Timeout performed Patient Re-evaluated:Patient Re-evaluated prior to inductionOxygen Delivery Method: Ambu bag Preoxygenation: Pre-oxygenation with 100% oxygen Intubation Type: IV induction Ventilation: Mask ventilation without difficulty

## 2013-09-13 NOTE — Anesthesia Postprocedure Evaluation (Signed)
  Anesthesia Post-op Note  Patient: Jeremy Briggs  Procedure(s) Performed: Procedure(s): CARDIOVERSION (N/A)  Patient Location: PACU and Endoscopy Unit  Anesthesia Type:General  Level of Consciousness: awake  Airway and Oxygen Therapy: Patient Spontanous Breathing  Post-op Pain: none  Post-op Assessment: Post-op Vital signs reviewed  Post-op Vital Signs: stable  Complications: No apparent anesthesia complications

## 2013-09-14 ENCOUNTER — Encounter (HOSPITAL_COMMUNITY): Payer: Self-pay | Admitting: Cardiology

## 2013-09-18 ENCOUNTER — Ambulatory Visit (HOSPITAL_COMMUNITY): Admit: 2013-09-18 | Payer: Self-pay | Admitting: Gastroenterology

## 2013-09-18 ENCOUNTER — Encounter (HOSPITAL_COMMUNITY): Payer: Self-pay

## 2013-09-18 SURGERY — EGD (ESOPHAGOGASTRODUODENOSCOPY)
Anesthesia: Moderate Sedation

## 2013-12-21 ENCOUNTER — Ambulatory Visit (INDEPENDENT_AMBULATORY_CARE_PROVIDER_SITE_OTHER): Payer: Medicare Other | Admitting: Diagnostic Neuroimaging

## 2013-12-21 ENCOUNTER — Encounter: Payer: Self-pay | Admitting: Diagnostic Neuroimaging

## 2013-12-21 ENCOUNTER — Encounter (INDEPENDENT_AMBULATORY_CARE_PROVIDER_SITE_OTHER): Payer: Self-pay

## 2013-12-21 VITALS — BP 95/59 | HR 54 | Temp 97.0°F | Ht 67.6 in | Wt 172.0 lb

## 2013-12-21 DIAGNOSIS — G3183 Dementia with Lewy bodies: Principal | ICD-10-CM

## 2013-12-21 DIAGNOSIS — F0280 Dementia in other diseases classified elsewhere without behavioral disturbance: Secondary | ICD-10-CM | POA: Insufficient documentation

## 2013-12-21 MED ORDER — DONEPEZIL HCL 5 MG PO TABS: 5.0000 mg | ORAL_TABLET | Freq: Every day | ORAL | Status: DC

## 2013-12-21 MED ORDER — CLONAZEPAM 0.25 MG PO TBDP: 0.2500 mg | ORAL_TABLET | Freq: Every day | ORAL | Status: DC

## 2013-12-21 NOTE — Patient Instructions (Addendum)
Stop exelon patch.  Start donepezil 5mg  at bedtime.  Start clonazepam 0.25mg  at bedtime. If this causes side effects, then stop it.   May consider over the counter melatonin supplement to help with sleep if clonazepam doesn't work.  Do not drink more than half glass of wine per day.  No driving.

## 2013-12-21 NOTE — Progress Notes (Signed)
[facility]  PATIENT: Jeremy BerkshireRoger N Seubert DOB: [birth year]  REFERRING CLINICIAN: Tiburcio PeaHarris HISTORY FROM: patient, wife, daughter-in-law REASON FOR VISIT: new consult   HISTORICAL  CHIEF COMPLAINT:  Chief Complaint  Patient presents with  . Dementia    HISTORY OF PRESENT ILLNESS:   UPDATE 12/20/13: Since last visit patient's have significantly worsened. He was tried on donepezil by his PCP, which apparently worsened his daytime agitation. He was [facility] but could not afford it. He was then switched to Exelon patch over the last 2 weeks, but the cost is still significant.  Patient is still in denial of significant memory, cognitive, physical declines. Patient's wife is under significant stress as patient has had significant increasing neuropsychiatric symptoms. Patient is having nighttime awakening with acting out his dreams. He punches and kicks in his sleep, thinks he is fighting a war at times. Sometimes he hears dogs in the house that are not marking. Sometimes he feels that there is a horse sleeping in his bed. Other times he finds it up at and tries to open doors and walk out of the home. Patient's wife states he appear to be in a half awake half asleep state. Sometimes she is able to talk him out of the episodes but not usually. This is happening on a nightly basis. The symptoms have been significant over the past 6 months. Patient also having significant increasing gait and balance difficulty. He is falling down frequently. He is not driving, although he desires to. He drinks 2 glasses of wine in the evening and usually sleeps 7 PM. He is usually resting well until 11 PM. After 11 PM he tends to start waking up and having episodes of confusion.  PRIOR HPI (03/15/12): 80 year old right-handed male with history of hypercholesterolemia, sleep apnea, depression, anxiety, fibromyalgia, skin cancer, remote head injury 1985, here for evaluation of memory loss since the  past 6-12 months.  Patient has noticed that he has difficulty with getting his words out, misplacing objects, forgetting to turn off the stove, radiating to close the horse gait, and other issues.  He thinks these are related to getting older, anticipated sale of their property, and other life stresses.  He endorses significant depression symptoms.  Patient's wife notices the same types of issues.  She is also noticed impulse control and mood lability.  They currently live on a 8 acre property and are looking to downsize.  Patient is reluctant to go through this change.   REVIEW OF SYSTEMS: Full 14 system review of systems performed and notable only for memory loss confusion weakness difficulty swallowing dizziness snoring restless legs depression anxiety mostly decreased energy disinterested hallucination incontinence shortness of breath snoring easy bruising easy bleeding itching moles.  ALLERGIES: Allergies  Allergen Reactions  . Statins     Goes crazy    HOME MEDICATIONS: Outpatient Prescriptions Prior to Visit  Medication Sig Dispense Refill  . apixaban (ELIQUIS) 5 MG TABS tablet Take 1 tablet (5 mg total) by mouth 2 (two) times daily.  60 tablet  0  . citalopram (CELEXA) 40 MG tablet Take 40 mg by mouth daily.      . Omega-3 Fatty Acids (FISH OIL PO) Take 3 capsules by mouth daily.      Marland Kitchen. omeprazole (PRILOSEC) 20 MG capsule Take 1 capsule (20 mg total) by mouth 2 (two) times daily before a meal.  60 capsule  0  . valsartan (DIOVAN) 80 MG tablet Take 160 mg by mouth daily.       .Marland Kitchen  diltiazem (CARDIZEM CD) 120 MG 24 hr capsule Take 1 capsule (120 mg total) by mouth daily at 6 PM.  30 capsule  0  . ezetimibe (ZETIA) 10 MG tablet Take 10 mg by mouth every morning.      . Memantine HCl ER (NAMENDA XR) 28 MG CP24 Take 1 capsule by mouth daily.      . Red Yeast Rice 600 MG CAPS Take 1 capsule by mouth daily.       No facility-administered medications prior to visit.    PAST MEDICAL  HISTORY: Past Medical History  Diagnosis Date  . Esophageal stricture   . Hypertension   . Hyperlipemia   . GERD (gastroesophageal reflux disease)   . Dementia   . Arthritis     PAST SURGICAL HISTORY: Past Surgical History  Procedure Laterality Date  . Esophageal stret    . Esophagogastroduodenoscopy  01/27/2012    Procedure: ESOPHAGOGASTRODUODENOSCOPY (EGD);  Surgeon: Petra Kuba, MD;  Location: [facility] ENDOSCOPY;  Service: Endoscopy;  Laterality: N/A;  . Esophagogastroduodenoscopy N/A 08/13/2013    Procedure: ESOPHAGOGASTRODUODENOSCOPY (EGD);  Surgeon: Vertell Novak., MD;  Location: [facility] ENDOSCOPY;  Service: Endoscopy;  Laterality: N/A;  . Foreign body removal N/A 08/13/2013    Procedure: FOREIGN BODY REMOVAL;  Surgeon: Vertell Novak., MD;  Location: [facility] ENDOSCOPY;  Service: Endoscopy;  Laterality: N/A;  . Cardioversion N/A 09/13/2013    Procedure: CARDIOVERSION;  Surgeon: Othella Boyer, MD;  Location: [facility] ENDOSCOPY;  Service: Cardiovascular;  Laterality: N/A;    FAMILY HISTORY: Family History  Problem Relation Age of Onset  . Alzheimer's disease Father   . Dementia Mother     SOCIAL HISTORY:  History   Social History  . Marital Status: Married    Spouse Name: Steward Drone    Number of Children: 2  . Years of Education: college   Occupational History  . Retired    Social History Main Topics  . Smoking status: Former Smoker -- 3.00 packs/day for 50 years    Types: Cigarettes    Quit date: 12/14/1998  . Smokeless tobacco: Never Used  . Alcohol Use: 1.2 oz/week    2 Glasses of wine per week     Comment: daily  . Drug Use: No  . Sexual Activity: Not on file   Other Topics Concern  . Not on file   Social History Narrative   Patient lives at home with spouse.   Caffeine Use: 4-5 cups daily     PHYSICAL EXAM  Filed Vitals:   12/21/13 1009  BP: 95/59  Pulse: 54  Temp: 97 F (36.1 C)  TempSrc: Oral  Height: 5' 7.6" (1.717 m)  Weight: 172 lb (78.019 kg)     Not recorded    Body mass index is 26.46 kg/(m^2).  GENERAL EXAM: Patient is in no distress; well developed, nourished and groomed; neck is supple  CARDIOVASCULAR: Regular rate and rhythm, no murmurs, no carotid bruits  NEUROLOGIC: MENTAL STATUS: awake, alert, oriented to "2015, WINTER, Friday, 13TH, [place], [place],) person, place and time, REGISTERS 3/3, RECALLS 2/3, CANNOT DRAW PENTAGONS. AFT7. ABLE TO DRAW CLOCK. 1/5 ON the SERIAL 7'S. DECR FLUENCY, MILD WORD FINDING DIFF, comprehension intact, naming intact, fund of knowledge appropriate; MMSE 20/30. POSITIVE SNOUT, POSITIVE MYERSON, POSITIVE PALMOMENTAL.  CRANIAL NERVE: no papilledema on fundoscopic exam, pupils equal and reactive to light, visual fields full to confrontation, extraocular muscles intact, no nystagmus, facial sensation and strength symmetric, hearing intact, palate elevates symmetrically, uvula  midline, shoulder shrug symmetric, tongue midline. MOTOR: INCREASED TONE IN BUE. INT REST TREMOR IN RUE. Normal bull, full strength in the BUE, BLE SENSORY: normal and symmetric to light touch COORDINATION: finger-nose-finger, fine finger movements normal REFLEXES: deep tendon reflexes present and symmetric GAIT/STATION: UNSTABLE WHEN STANDING. SLOW, CAUTIOUS GAIT. SLOW TURNS. EN BLOC TURNS. MILD TREMOR WITH WALKING.   DIAGNOSTIC DATA (LABS, IMAGING, TESTING) - I reviewed patient records, labs, notes, testing and imaging myself where available.  Lab Results  Component Value Date   WBC 7.4 08/14/2013   HGB 14.4 08/14/2013   HCT 42.3 08/14/2013   MCV 94.0 08/14/2013   PLT 198 08/14/2013      Component Value Date/Time   NA 142 08/13/2013 1731   K 4.1 08/13/2013 1731   CL 106 08/13/2013 1731   CO2 25 08/13/2013 1731   GLUCOSE 104* 08/13/2013 1731   BUN 24* 08/13/2013 1731   CREATININE 0.95 08/13/2013 1731   CALCIUM 9.3 08/13/2013 1731   PROT 7.1 08/13/2013 1731   ALBUMIN 3.8 08/13/2013 1731   AST 17 08/13/2013 1731   ALT 12 08/13/2013 1731    ALKPHOS 51 08/13/2013 1731   BILITOT 0.7 08/13/2013 1731   GFRNONAA 78* 08/13/2013 1731   GFRAA >90 08/13/2013 1731   Lab Results  Component Value Date   CHOL 280* 06/02/2007   HDL 56.7 06/02/2007   LDLDIRECT 195.0 06/02/2007   TRIG 113 06/02/2007   CHOLHDL 4.9 CALC 06/02/2007   Lab Results  Component Value Date   HGBA1C 5.7* 08/13/2013   No results found for this basename: VITAMINB12   Lab Results  Component Value Date   TSH 1.086 08/13/2013    I reviewed images myself and agree with interpretation.   02/25/12 MRI brain - No acute intracranial abnormality.  Mild for age nonspecific white matter signal changes, and  generalized cerebral volume loss with no areas of disproportionate  regional involvement.   ASSESSMENT AND PLAN  80 y.o. year old male here with progressive memory loss, confusion, visual hallucinations, rest tremor, gait difficulty. Exam show frontal release sign and MMSE 20/30. Also with acting out dreams at night time spells.  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  Start donepezil 5mg  at bedtime.  Start clonazepam 0.25mg  at bedtime. If this causes side effects, then stop it.   May consider over the counter melatonin supplement to help with sleep if clonazepam doesn't work.  Do not drink more than half glass of wine per day.  No driving.    Meds ordered this encounter  Medications  . DISCONTD: rivastigmine (EXELON) 4.6 mg/24hr    Sig: Place 4.6 mg onto the skin daily.  Marland Kitchen donepezil (ARICEPT) 5 MG tablet    Sig: Take 1 tablet (5 mg total) by mouth at bedtime.    Dispense:  30 tablet    Refill:  0  . clonazePAM (KLONOPIN) 0.25 MG disintegrating tablet    Sig: Take 1 tablet (0.25 mg total) by mouth at bedtime.    Dispense:  30 tablet    Refill:  5   Return in about 3 months (around 03/21/2014).     Suanne Marker, MD 12/21/2013, 11:33 AM Certified in Neurology, Neurophysiology and  Neuroimaging  [facility] [address] [place], [place] [address] [phone]

## 2014-01-29 ENCOUNTER — Other Ambulatory Visit: Payer: Self-pay

## 2014-01-29 MED ORDER — DONEPEZIL HCL 5 MG PO TABS: 5.0000 mg | ORAL_TABLET | Freq: Every day | ORAL | Status: DC

## 2014-03-26 ENCOUNTER — Ambulatory Visit (INDEPENDENT_AMBULATORY_CARE_PROVIDER_SITE_OTHER): Payer: Medicare Other | Admitting: Diagnostic Neuroimaging

## 2014-03-26 ENCOUNTER — Encounter (INDEPENDENT_AMBULATORY_CARE_PROVIDER_SITE_OTHER): Payer: Self-pay

## 2014-03-26 ENCOUNTER — Encounter: Payer: Self-pay | Admitting: Diagnostic Neuroimaging

## 2014-03-26 VITALS — BP 103/66 | HR 57 | Temp 98.0°F | Ht 67.5 in | Wt 180.0 lb

## 2014-03-26 DIAGNOSIS — F0280 Dementia in other diseases classified elsewhere without behavioral disturbance: Secondary | ICD-10-CM

## 2014-03-26 DIAGNOSIS — G3183 Dementia with Lewy bodies: Principal | ICD-10-CM

## 2014-03-26 MED ORDER — DONEPEZIL HCL 10 MG PO TABS: 10.0000 mg | ORAL_TABLET | Freq: Every day | ORAL | Status: AC

## 2014-03-26 MED ORDER — CLONAZEPAM 0.5 MG PO TABS: 0.5000 mg | ORAL_TABLET | Freq: Every day | ORAL | Status: AC

## 2014-03-26 NOTE — Patient Instructions (Signed)
Increase clonazepam and donepezil.

## 2014-03-26 NOTE — Progress Notes (Signed)
[facility]  PATIENT: Jeremy Briggs DOB: [birth year]  REFERRING CLINICIAN: Harris HISTORY FROM: patient, wife, daughter-in-law REASON FOR VISIT: new consult   HISTORICAL  CHIEF COMPLAINT:  No chief complaint on file.   HISTORY OF PRESENT ILLNESS:   UPDATE 03/26/14: Since last visit, doing better in the AM, on donepezil and clonazepam. Still with confusion, hallucination, at nighttime. Still with nightmares. No side effects from meds. Today, he is in good mood.  UPDATE 12/20/13: Since last visit patient's have significantly worsened. He was tried on donepezil by his PCP, which apparently worsened his daytime agitation. [facility] but could not [facility]. He was then switched to Exelon patch over the last 2 weeks, but the cost is still significant.  Patient is still in denial of significant memory, cognitive, physical declines. Patient's wife is under significant stress as patient has had significant increasing neuropsychiatric symptoms. Patient is having nighttime awakening with acting out his dreams. He punches and kicks in his sleep, thinks he is fighting a war at times. Sometimes he hears dogs in the house that are not marking. Sometimes he feels that there is a horse sleeping in his bed. Other times he finds it up at and tries to open doors and walk out of the home. Patient's wife states he appear to be in a half awake half asleep state. Sometimes she is able to talk him out of the episodes but not usually. This is happening on a nightly basis. The symptoms have been significant over the past 6 months. Patient also having significant increasing gait and balance difficulty. He is falling down frequently. He is not driving, although he desires to. He drinks 2 glasses of wine in the evening and usually sleeps 7 PM. He is usually resting well until 11 PM. After 11 PM he tends to start waking up and having episodes of confusion.  PRIOR HPI (03/15/12):  80 year old right-handed male with history of hypercholesterolemia, sleep apnea, depression, anxiety, fibromyalgia, skin cancer, remote head injury 1985, here for evaluation of memory loss since the past 6-12 months.  Patient has noticed that he has difficulty with getting his words out, misplacing objects, forgetting to turn off the stove, radiating to close the horse gait, and other issues.  He thinks these are related to getting older, anticipated sale of their property, and other life stresses.  He endorses significant depression symptoms.  Patient's wife notices the same types of issues.  She is also noticed impulse control and mood lability.  They currently live on a 8 acre property and are looking to downsize.  Patient is reluctant to go through this change.   REVIEW OF SYSTEMS: Full 14 system review of systems performed and notable only for fatigue runny nose trouble swallowing shortness of breath light sensitivity cold intolerance intolerance apnea frequent waking daytime sleepiness snoring sleep talking sleep walking acting out walking difficulty agitation behavior problem confusion depression anxiety hallucinations hyperactivity at night memory loss dizziness tremor frequent urination.   ALLERGIES: Allergies  Allergen Reactions  . Statins     Goes crazy    HOME MEDICATIONS: Outpatient Prescriptions Prior to Visit  Medication Sig Dispense Refill  . apixaban (ELIQUIS) 5 MG TABS tablet Take 1 tablet (5 mg total) by mouth 2 (two) times daily.  60 tablet  0  . citalopram (CELEXA) 40 MG tablet Take 40 mg by mouth daily.      Marland Kitchen. omeprazole (PRILOSEC) 20 MG capsule Take 1 capsule (20 mg  total) by mouth 2 (two) times daily before a meal.  60 capsule  0  . valsartan (DIOVAN) 80 MG tablet Take 160 mg by mouth daily.       . clonazePAM (KLONOPIN) 0.25 MG disintegrating tablet Take 1 tablet (0.25 mg total) by mouth at bedtime.  30 tablet  5  . donepezil (ARICEPT) 5 MG tablet Take 1 tablet (5 mg  total) by mouth at bedtime.  30 tablet  1  . Omega-3 Fatty Acids (FISH OIL PO) Take 3 capsules by mouth daily.       No facility-administered medications prior to visit.    PAST MEDICAL HISTORY: Past Medical History  Diagnosis Date  . Esophageal stricture   . Hypertension   . Hyperlipemia   . GERD (gastroesophageal reflux disease)   . Dementia   . Arthritis     PAST SURGICAL HISTORY: Past Surgical History  Procedure Laterality Date  . Esophageal stret    . Esophagogastroduodenoscopy  01/27/2012    Procedure: ESOPHAGOGASTRODUODENOSCOPY (EGD);  Surgeon: Petra Kuba, MD;  Location: [facility] ENDOSCOPY;  Service: Endoscopy;  Laterality: N/A;  . Esophagogastroduodenoscopy N/A 08/13/2013    Procedure: ESOPHAGOGASTRODUODENOSCOPY (EGD);  Surgeon: Vertell Novak., MD;  Location: [facility] ENDOSCOPY;  Service: Endoscopy;  Laterality: N/A;  . Foreign body removal N/A 08/13/2013    Procedure: FOREIGN BODY REMOVAL;  Surgeon: Vertell Novak., MD;  Location: [facility] ENDOSCOPY;  Service: Endoscopy;  Laterality: N/A;  . Cardioversion N/A 09/13/2013    Procedure: CARDIOVERSION;  Surgeon: Othella Boyer, MD;  Location: [facility] ENDOSCOPY;  Service: Cardiovascular;  Laterality: N/A;    FAMILY HISTORY: Family History  Problem Relation Age of Onset  . Alzheimer's disease Father   . Dementia Mother   . Cancer Daughter     SOCIAL HISTORY:  History   Social History  . Marital Status: Married    Spouse Name: Steward Drone    Number of Children: 2  . Years of Education: college   Occupational History  . Retired    Social History Main Topics  . Smoking status: Former Smoker -- 3.00 packs/day for 50 years    Types: Cigarettes    Quit date: 12/14/1998  . Smokeless tobacco: Never Used  . Alcohol Use: 1.2 oz/week    2 Glasses of wine per week     Comment: daily  . Drug Use: No  . Sexual Activity: Not on file   Other Topics Concern  . Not on file   Social History Narrative   Patient lives at home with spouse.    Caffeine Use: 4-5 cups daily     PHYSICAL EXAM  Filed Vitals:   03/26/14 1121  BP: 103/66  Pulse: 57  Temp: 98 F (36.7 C)  TempSrc: Oral  Height: 5' 7.5" (1.715 m)  Weight: 180 lb (81.647 kg)    Not recorded    Body mass index is 27.76 kg/(m^2).  GENERAL EXAM: Patient is in no distress; well developed, nourished and groomed; neck is supple  CARDIOVASCULAR: Regular rate and rhythm, no murmurs, no carotid bruits  NEUROLOGIC: MENTAL STATUS: awake, alert; MMSE 24/30 (MISSES DATE, SERIAL SEVENS AND PENTAGON). DECR FLUENCY, MILD WORD FINDING DIFF. POSITIVE SNOUT, POSITIVE MYERSON, POSITIVE PALMOMENTAL. AFT 11. GDS 7. CRANIAL NERVE: no papilledema on fundoscopic exam, pupils equal and reactive to light, visual fields full to confrontation, extraocular muscles intact, no nystagmus, facial sensation and strength symmetric, hearing intact, palate elevates symmetrically, uvula midline, shoulder shrug symmetric, tongue midline. MOTOR: INCREASED  TONE IN BUE. INT REST TREMOR IN RUE. Normal bull, full strength in the BUE, BLE SENSORY: normal and symmetric to light touch COORDINATION: finger-nose-finger, fine finger movements normal REFLEXES: deep tendon reflexes present and symmetric GAIT/STATION: UNSTABLE WHEN STANDING. SLOW, CAUTIOUS GAIT. SLOW TURNS. EN BLOC TURNS. MILD TREMOR WITH WALKING.   DIAGNOSTIC DATA (LABS, IMAGING, TESTING) - I reviewed patient records, labs, notes, testing and imaging myself where available.  Lab Results  Component Value Date   WBC 7.4 08/14/2013   HGB 14.4 08/14/2013   HCT 42.3 08/14/2013   MCV 94.0 08/14/2013   PLT 198 08/14/2013      Component Value Date/Time   NA 142 08/13/2013 1731   K 4.1 08/13/2013 1731   CL 106 08/13/2013 1731   CO2 25 08/13/2013 1731   GLUCOSE 104* 08/13/2013 1731   BUN 24* 08/13/2013 1731   CREATININE 0.95 08/13/2013 1731   CALCIUM 9.3 08/13/2013 1731   PROT 7.1 08/13/2013 1731   ALBUMIN 3.8 08/13/2013 1731   AST 17 08/13/2013 1731   ALT 12  08/13/2013 1731   ALKPHOS 51 08/13/2013 1731   BILITOT 0.7 08/13/2013 1731   GFRNONAA 78* 08/13/2013 1731   GFRAA >90 08/13/2013 1731   Lab Results  Component Value Date   CHOL 280* 06/02/2007   HDL 56.7 06/02/2007   LDLDIRECT 195.0 06/02/2007   TRIG 113 06/02/2007   CHOLHDL 4.9 CALC 06/02/2007   Lab Results  Component Value Date   HGBA1C 5.7* 08/13/2013   No results found for this basename: VITAMINB12   Lab Results  Component Value Date   TSH 1.086 08/13/2013    I reviewed images myself and agree with interpretation. -[facility]  02/25/12 MRI brain - No acute intracranial abnormality. Mild for age nonspecific white matter signal changes, and  generalized cerebral volume loss with no areas of disproportionate regional involvement.   ASSESSMENT AND PLAN  80 y.o. year old male here with progressive memory loss, confusion, visual hallucinations, rest tremor, gait difficulty. Exam show frontal release sign and MMSE 24/30. Also with acting out dreams at night time spells.  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  No driving.   Return in about 6 months (around 09/25/2014) for with Heide GuileLynn Lam or Rettie Laird.  Suanne MarkerVIKRAM R. Arelys Glassco, MD 03/26/2014, 12:33 PM Certified in Neurology, Neurophysiology and Neuroimaging  [facility] [address], [address] [place], [place] [address] [phone]

## 2014-09-25 ENCOUNTER — Ambulatory Visit: Payer: Medicare Other | Admitting: Nurse Practitioner

## 2014-11-06 ENCOUNTER — Emergency Department (HOSPITAL_COMMUNITY): Payer: Medicare Other

## 2014-11-06 ENCOUNTER — Encounter (HOSPITAL_COMMUNITY): Payer: Self-pay

## 2014-11-06 ENCOUNTER — Emergency Department (HOSPITAL_COMMUNITY)
Admission: EM | Admit: 2014-11-06 | Discharge: 2014-11-06 | Disposition: A | Discharge status: Home / Self Care | Payer: Medicare Other | Attending: Emergency Medicine | Admitting: Emergency Medicine

## 2014-11-06 DIAGNOSIS — S36220A Contusion of head of pancreas, initial encounter: Secondary | ICD-10-CM | POA: Diagnosis not present

## 2014-11-06 DIAGNOSIS — Z7901 Long term (current) use of anticoagulants: Secondary | ICD-10-CM | POA: Diagnosis not present

## 2014-11-06 DIAGNOSIS — K219 Gastro-esophageal reflux disease without esophagitis: Secondary | ICD-10-CM | POA: Insufficient documentation

## 2014-11-06 DIAGNOSIS — M1990 Unspecified osteoarthritis, unspecified site: Secondary | ICD-10-CM | POA: Insufficient documentation

## 2014-11-06 DIAGNOSIS — S299XXA Unspecified injury of thorax, initial encounter: Secondary | ICD-10-CM | POA: Insufficient documentation

## 2014-11-06 DIAGNOSIS — S0093XA Contusion of unspecified part of head, initial encounter: Secondary | ICD-10-CM

## 2014-11-06 DIAGNOSIS — R079 Chest pain, unspecified: Secondary | ICD-10-CM | POA: Diagnosis not present

## 2014-11-06 DIAGNOSIS — Y998 Other external cause status: Secondary | ICD-10-CM | POA: Diagnosis not present

## 2014-11-06 DIAGNOSIS — F0390 Unspecified dementia without behavioral disturbance: Secondary | ICD-10-CM | POA: Diagnosis not present

## 2014-11-06 DIAGNOSIS — S300XXA Contusion of lower back and pelvis, initial encounter: Secondary | ICD-10-CM | POA: Diagnosis not present

## 2014-11-06 DIAGNOSIS — Y929 Unspecified place or not applicable: Secondary | ICD-10-CM | POA: Insufficient documentation

## 2014-11-06 DIAGNOSIS — S20221A Contusion of right back wall of thorax, initial encounter: Secondary | ICD-10-CM

## 2014-11-06 DIAGNOSIS — Y9389 Activity, other specified: Secondary | ICD-10-CM | POA: Insufficient documentation

## 2014-11-06 DIAGNOSIS — I10 Essential (primary) hypertension: Secondary | ICD-10-CM | POA: Insufficient documentation

## 2014-11-06 DIAGNOSIS — Z23 Encounter for immunization: Secondary | ICD-10-CM | POA: Insufficient documentation

## 2014-11-06 DIAGNOSIS — Z87891 Personal history of nicotine dependence: Secondary | ICD-10-CM | POA: Insufficient documentation

## 2014-11-06 DIAGNOSIS — W108XXA Fall (on) (from) other stairs and steps, initial encounter: Secondary | ICD-10-CM | POA: Insufficient documentation

## 2014-11-06 DIAGNOSIS — Z8639 Personal history of other endocrine, nutritional and metabolic disease: Secondary | ICD-10-CM | POA: Insufficient documentation

## 2014-11-06 DIAGNOSIS — S0990XA Unspecified injury of head, initial encounter: Secondary | ICD-10-CM | POA: Diagnosis present

## 2014-11-06 DIAGNOSIS — W19XXXA Unspecified fall, initial encounter: Secondary | ICD-10-CM

## 2014-11-06 DIAGNOSIS — Z79899 Other long term (current) drug therapy: Secondary | ICD-10-CM | POA: Diagnosis not present

## 2014-11-06 LAB — CBC WITH DIFFERENTIAL/PLATELET
BASOS PCT: 1 % (ref 0–1)
Basophils Absolute: 0 10*3/uL (ref 0.0–0.1)
EOS PCT: 1 % (ref 0–5)
Eosinophils Absolute: 0.1 10*3/uL (ref 0.0–0.7)
HCT: 45.3 % (ref 39.0–52.0)
Hemoglobin: 15.7 g/dL (ref 13.0–17.0)
Lymphocytes Relative: 15 % (ref 12–46)
Lymphs Abs: 1.2 10*3/uL (ref 0.7–4.0)
MCH: 32.6 pg (ref 26.0–34.0)
MCHC: 34.7 g/dL (ref 30.0–36.0)
MCV: 94.2 fL (ref 78.0–100.0)
Monocytes Absolute: 0.5 10*3/uL (ref 0.1–1.0)
Monocytes Relative: 6 % (ref 3–12)
NEUTROS PCT: 77 % (ref 43–77)
Neutro Abs: 5.9 10*3/uL (ref 1.7–7.7)
PLATELETS: 222 10*3/uL (ref 150–400)
RBC: 4.81 MIL/uL (ref 4.22–5.81)
RDW: 12.7 % (ref 11.5–15.5)
WBC: 7.7 10*3/uL (ref 4.0–10.5)

## 2014-11-06 LAB — COMPREHENSIVE METABOLIC PANEL
ALBUMIN: 3.9 g/dL (ref 3.5–5.2)
ALK PHOS: 55 U/L (ref 39–117)
ALT: 24 U/L (ref 0–53)
AST: 25 U/L (ref 0–37)
Anion gap: 14 (ref 5–15)
BUN: 16 mg/dL (ref 6–23)
CO2: 25 mEq/L (ref 19–32)
Calcium: 9.5 mg/dL (ref 8.4–10.5)
Chloride: 99 mEq/L (ref 96–112)
Creatinine, Ser: 0.89 mg/dL (ref 0.50–1.35)
GFR calc non Af Amer: 80 mL/min — ABNORMAL LOW (ref >90)
Glucose, Bld: 109 mg/dL — ABNORMAL HIGH (ref 70–99)
POTASSIUM: 4.7 meq/L (ref 3.7–5.3)
SODIUM: 138 meq/L (ref 137–147)
TOTAL PROTEIN: 7.3 g/dL (ref 6.0–8.3)
Total Bilirubin: 0.5 mg/dL (ref 0.3–1.2)

## 2014-11-06 MED ORDER — HYDROMORPHONE HCL 1 MG/ML IJ SOLN
1.0000 mg | Freq: Once | INTRAMUSCULAR | Status: AC
  Administered 2014-11-06: 1 mg via INTRAVENOUS
  Filled 2014-11-06: qty 1

## 2014-11-06 MED ORDER — TETANUS-DIPHTH-ACELL PERTUSSIS 5-2.5-18.5 LF-MCG/0.5 IM SUSP
0.5000 mL | Freq: Once | INTRAMUSCULAR | Status: AC
  Administered 2014-11-06: 0.5 mL via INTRAMUSCULAR
  Filled 2014-11-06: qty 0.5

## 2014-11-06 MED ORDER — OXYCODONE-ACETAMINOPHEN 5-325 MG PO TABS: 1.0000 | ORAL_TABLET | Freq: Four times a day (QID) | ORAL | Status: DC | PRN

## 2014-11-06 MED ORDER — ONDANSETRON HCL 4 MG/2ML IJ SOLN
4.0000 mg | Freq: Once | INTRAMUSCULAR | Status: AC
  Administered 2014-11-06: 4 mg via INTRAVENOUS
  Filled 2014-11-06: qty 2

## 2014-11-06 NOTE — ED Notes (Signed)
Per [facility], pt from home after falling from stairs back on to his head, unk LOC, hx of dementia. Has hematoma and lac to posterior head. Full c-spine precautions. Pain to left side and thoracic area.

## 2014-11-06 NOTE — ED Notes (Signed)
Dr. Estell Harpinzammit at the bedside. Pt removed from backboard and cleared by Dr. Estell HarpinZammit.

## 2014-11-06 NOTE — ED Provider Notes (Signed)
CSN: [id number]     Arrival date & time 11/06/14  1003 History   First MD Initiated Contact with Patient 11/06/14 1020     Chief Complaint  Patient presents with  . Fall  . Laceration     (Consider location/radiation/quality/duration/timing/severity/associated sxs/prior Treatment) Patient is a 80 y.o. male presenting with fall and skin laceration. The history is provided by the patient (the pt tripped and fell on some steps.  no loc).  Fall This is a new problem. The current episode started 6 to 12 hours ago. The problem occurs constantly. The problem has not changed since onset.Pertinent negatives include no chest pain, no abdominal pain and no headaches. Nothing aggravates the symptoms. Nothing relieves the symptoms.  Laceration   Past Medical History  Diagnosis Date  . Esophageal stricture   . Hypertension   . Hyperlipemia   . GERD (gastroesophageal reflux disease)   . Dementia   . Arthritis    Past Surgical History  Procedure Laterality Date  . Esophageal stret    . Esophagogastroduodenoscopy  01/27/2012    Procedure: ESOPHAGOGASTRODUODENOSCOPY (EGD);  Surgeon: Petra KubaMarc E Magod, MD;  [facility];  Service: Endoscopy;  Laterality: N/A;  . Esophagogastroduodenoscopy N/A 08/13/2013    Procedure: ESOPHAGOGASTRODUODENOSCOPY (EGD);  Surgeon: Vertell NovakJames L Edwards Jr., MD;  [facility];  Service: Endoscopy;  Laterality: N/A;  . Foreign body removal N/A 08/13/2013    Procedure: FOREIGN BODY REMOVAL;  Surgeon: Vertell NovakJames L Edwards Jr., MD;  Location: [facility] ENDOSCOPY;  Service: Endoscopy;  Laterality: N/A;  . Cardioversion N/A 09/13/2013    Procedure: CARDIOVERSION;  Surgeon: Othella BoyerWilliam S Tilley, MD;  [facility];  Service: Cardiovascular;  Laterality: N/A;   Family History  Problem Relation Age of Onset  . Alzheimer's disease Father   . Dementia Mother   . Cancer Daughter    History  Substance Use Topics  . Smoking status: Former Smoker -- 3.00 packs/day for 50 years     Types: Cigarettes    Quit date: 12/14/1998  . Smokeless tobacco: Never Used  . Alcohol Use: 1.2 oz/week    2 Glasses of wine per week     Comment: daily    Review of Systems  Constitutional: Negative for appetite change and fatigue.  HENT: Negative for congestion, ear discharge and sinus pressure.   Eyes: Negative for discharge.  Respiratory: Negative for cough.   Cardiovascular: Negative for chest pain.  Gastrointestinal: Negative for abdominal pain and diarrhea.  Genitourinary: Negative for frequency and hematuria.  Musculoskeletal: Positive for back pain.  Skin: Negative for rash.  Neurological: Negative for seizures and headaches.  Psychiatric/Behavioral: Negative for hallucinations.      Allergies  Statins  Home Medications   Prior to Admission medications   Medication Sig Start Date End Date Taking? Authorizing Provider  apixaban (ELIQUIS) 5 MG TABS tablet Take 1 tablet (5 mg total) by mouth 2 (two) times daily. 08/14/13  Yes Elease EtienneAnand D Hongalgi, MD  citalopram (CELEXA) 40 MG tablet Take 40 mg by mouth daily.   Yes Historical Provider, MD  clonazePAM (KLONOPIN) 0.5 MG tablet Take 1 tablet (0.5 mg total) by mouth at bedtime. 03/26/14  Yes Suanne MarkerVikram R Penumalli, MD  donepezil (ARICEPT) 10 MG tablet Take 1 tablet (10 mg total) by mouth at bedtime. 03/26/14  Yes Suanne MarkerVikram R Penumalli, MD  omeprazole (PRILOSEC) 20 MG capsule Take 1 capsule (20 mg total) by mouth 2 (two) times daily before a meal. Patient taking differently: Take 20 mg by mouth  daily.  08/14/13  Yes Elease EtienneAnand D Hongalgi, MD  Red Yeast Rice Extract (RED YEAST RICE PO) Take 2 capsules by mouth 2 (two) times daily.    Yes Historical Provider, MD  valsartan (DIOVAN) 80 MG tablet Take 80 mg by mouth daily. 10/17/14  Yes Historical Provider, MD  oxyCODONE-acetaminophen (PERCOCET/ROXICET) 5-325 MG per tablet Take 1 tablet by mouth every 6 (six) hours as needed for moderate pain or severe pain. 11/06/14   Benny LennertJoseph L Hassan Blackshire, MD   BP  132/73 mmHg  Temp(Src) 98.3 F (36.8 C) (Oral)  Resp 16  Ht 5\' 7"  (1.702 m)  Wt 160 lb (72.576 kg)  BMI 25.05 kg/m2  SpO2 98% Physical Exam  Constitutional: He is oriented to person, place, and time. He appears well-developed.  HENT:  Head: Normocephalic.  Abrasion to occipital head  Eyes: Conjunctivae and EOM are normal. No scleral icterus.  Neck: Neck supple. No thyromegaly present.  Cardiovascular: Normal rate and regular rhythm.  Exam reveals no gallop and no friction rub.   No murmur heard. Pulmonary/Chest: No stridor. He has no wheezes. He has no rales. He exhibits no tenderness.  Abdominal: He exhibits no distension. There is no tenderness. There is no rebound.  Musculoskeletal: Normal range of motion. He exhibits no edema.  Tender left scapula and mid thoracic spine  Lymphadenopathy:    He has no cervical adenopathy.  Neurological: He is oriented to person, place, and time. He exhibits normal muscle tone. Coordination normal.  Skin: No rash noted. No erythema.  Psychiatric: He has a normal mood and affect. His behavior is normal.    ED Course  Procedures (including critical care time) Labs Review Labs Reviewed  COMPREHENSIVE METABOLIC PANEL - Abnormal; Notable for the following:    Glucose, Bld 109 (*)    GFR calc non Af Amer 80 (*)    All other components within normal limits  CBC WITH DIFFERENTIAL    Imaging Review Ct Head Wo Contrast  11/06/2014   CLINICAL DATA:  Larey SeatFell backwards while walking up the basement steps in his home. Pt has laceration to the back of his head and chest pain. Pt does not recall the events of the fall or how he fell. Initial encounter.  EXAM: CT HEAD WITHOUT CONTRAST  CT CERVICAL SPINE WITHOUT CONTRAST  TECHNIQUE: Multidetector CT imaging of the head and cervical spine was performed following the standard protocol without intravenous contrast. Multiplanar CT image reconstructions of the cervical spine were also generated.  COMPARISON:  Brain  MR 02/25/2012  FINDINGS: CT HEAD FINDINGS  Sinuses/Soft tissues: High posterior scalp soft tissue swelling is relatively mild on image 57. Mucosal thickening in the right maxillary sinus. No skull fracture. Clear mastoid air cells.  Intracranial: Cerebral atrophy, likely age appropriate. No mass lesion, hemorrhage, hydrocephalus, acute infarct, intra-axial, or extra-axial fluid collection.  CT CERVICAL SPINE FINDINGS  Spinal visualization through the mid T2 level. Prevertebral soft tissues are within normal limits. Dense carotid atherosclerosis bilaterally. Biapical partially calcified pleural parenchymal scarring. No apical pneumothorax. Multilevel spondylosis. Multilevel areas of bilateral neural foraminal narrowing. Central canal stenosis at C5-6 and C6-7.  Skull base intact. Maintenance of vertebral body height. Trace C5-6 retrolisthesis. Favored to be degenerative, given advanced degenerative disc disease at this level. Facet arthropathy, greater on the left, including at C4-5 and C5-6. Coronal reformats demonstrate only degenerative change of the C1-2 articulation.  IMPRESSION: 1. Posterior scalp soft tissue swelling, without acute intracranial abnormality. 2. Advanced cervical spondylosis, without acute fracture.  Trace C5-6 retrolisthesis, favored to be degenerative.   Electronically Signed   By: Jeronimo Greaves M.D.   On: 11/06/2014 11:33   Ct Chest Wo Contrast  11/06/2014   CLINICAL DATA:  Chest pain after fall down steps at home.  EXAM: CT CHEST WITHOUT CONTRAST  TECHNIQUE: Multidetector CT imaging of the chest was performed following the standard protocol without IV contrast.  COMPARISON:  CT scan of March 24, 2004.  FINDINGS: No pneumothorax or pleural effusion is noted. Stable biapical scarring is noted. New 7.4 mm nodule is noted in the left upper lobe best seen on image number 23. No other significant pulmonary parenchymal abnormality is noted. No mediastinal mass or adenopathy is noted. Visualized  portion of upper abdomen appears normal. Coronary artery calcifications are noted. No significant osseous abnormality is noted.  IMPRESSION: Coronary artery calcifications suggesting coronary artery disease.  Stable biapical scarring is noted compared to prior exam.  New 7.4 mm nodule seen in left upper lobe. If the patient is at high risk for bronchogenic carcinoma, follow-up chest CT at 3-41months is recommended. If the patient is at low risk for bronchogenic carcinoma, follow-up chest CT at 6-12 months is recommended. This recommendation follows the consensus statement: Guidelines for Management of Small Pulmonary Nodules Detected on CT Scans: A Statement from the Fleischner Society as published in Radiology 2005; 237:395-400.   Electronically Signed   By: Roque Lias M.D.   On: 11/06/2014 11:34   Ct Cervical Spine Wo Contrast  11/06/2014   CLINICAL DATA:  Larey Seat backwards while walking up the basement steps in his home. Pt has laceration to the back of his head and chest pain. Pt does not recall the events of the fall or how he fell. Initial encounter.  EXAM: CT HEAD WITHOUT CONTRAST  CT CERVICAL SPINE WITHOUT CONTRAST  TECHNIQUE: Multidetector CT imaging of the head and cervical spine was performed following the standard protocol without intravenous contrast. Multiplanar CT image reconstructions of the cervical spine were also generated.  COMPARISON:  Brain MR 02/25/2012  FINDINGS: CT HEAD FINDINGS  Sinuses/Soft tissues: High posterior scalp soft tissue swelling is relatively mild on image 57. Mucosal thickening in the right maxillary sinus. No skull fracture. Clear mastoid air cells.  Intracranial: Cerebral atrophy, likely age appropriate. No mass lesion, hemorrhage, hydrocephalus, acute infarct, intra-axial, or extra-axial fluid collection.  CT CERVICAL SPINE FINDINGS  Spinal visualization through the mid T2 level. Prevertebral soft tissues are within normal limits. Dense carotid atherosclerosis bilaterally.  Biapical partially calcified pleural parenchymal scarring. No apical pneumothorax. Multilevel spondylosis. Multilevel areas of bilateral neural foraminal narrowing. Central canal stenosis at C5-6 and C6-7.  Skull base intact. Maintenance of vertebral body height. Trace C5-6 retrolisthesis. Favored to be degenerative, given advanced degenerative disc disease at this level. Facet arthropathy, greater on the left, including at C4-5 and C5-6. Coronal reformats demonstrate only degenerative change of the C1-2 articulation.  IMPRESSION: 1. Posterior scalp soft tissue swelling, without acute intracranial abnormality. 2. Advanced cervical spondylosis, without acute fracture. Trace C5-6 retrolisthesis, favored to be degenerative.   Electronically Signed   By: Jeronimo Greaves M.D.   On: 11/06/2014 11:33     EKG Interpretation None      MDM   Final diagnoses:  Fall  Head contusion, initial encounter  Back contusion, right, initial encounter    Fall with back pain and head abrasion.  Pt to follow up with pcp    Benny Lennert, MD 11/06/14 [phone]

## 2014-11-06 NOTE — Discharge Instructions (Signed)
Follow up with your md next week for recheck.  Return sooner if problems °

## 2014-11-06 NOTE — ED Notes (Signed)
Patient transported to CT 

## 2015-09-18 ENCOUNTER — Ambulatory Visit (INDEPENDENT_AMBULATORY_CARE_PROVIDER_SITE_OTHER): Payer: Medicare Other | Admitting: Podiatry

## 2015-09-18 ENCOUNTER — Encounter: Payer: Self-pay | Admitting: Podiatry

## 2015-09-18 VITALS — BP 101/57 | HR 60 | Resp 18

## 2015-09-18 DIAGNOSIS — M79676 Pain in unspecified toe(s): Secondary | ICD-10-CM

## 2015-09-18 DIAGNOSIS — B351 Tinea unguium: Secondary | ICD-10-CM

## 2015-09-18 NOTE — Progress Notes (Signed)
Subjective:     Patient ID: Jeremy Briggs, male   DOB: 16-May-1934, 81 y.o.   MRN: [id number]  HPI 81 year old male presents to the office today with his wife for thick, painful, elongated toenails for which she is unable to trim himself. Denies any redness or drainage from around the nail sites. His nails are painful particularly in shoe gear as it caused irritation. No other complaints at this time.  Review of Systems  All other systems reviewed and are negative.      Objective:   Physical Exam Awake, alert, NAD DP/PT pulses 2/4 bilaterally, CRT less than 3 seconds Protective sensation appears to be intact with Dorann OuSimms Weinstein monofilament Nails are hypertrophic, dystrophic, brittle, discolored, elongated x10. There is no surrounding erythema or drainage on the toenails. There is tenderness to palpation overlying the nails 1-5 bilaterally. There are no open lesions or pre-ulcer lesions identified bilaterally. There is no pain with calf compression, swelling, warmth, erythema.     Assessment:     81 year old male with symptomatic onychomycosis     Plan:     -Treatment options discussed including all alternatives, risks, and complications -Nails sharply debrided x10 without complication/bleeding -Discussed importance of daily foot inspection. -Follow-up in 3 months or sooner if any problems arise. In the meantime, encouraged to call the office with any questions, concerns, change in symptoms.   Ovid CurdMatthew Paydon Carll, DPM

## 2015-12-11 ENCOUNTER — Encounter: Payer: Self-pay | Admitting: Podiatry

## 2015-12-11 ENCOUNTER — Ambulatory Visit: Payer: Medicare Other

## 2015-12-11 ENCOUNTER — Ambulatory Visit (INDEPENDENT_AMBULATORY_CARE_PROVIDER_SITE_OTHER): Payer: Medicare Other | Admitting: Podiatry

## 2015-12-11 DIAGNOSIS — B351 Tinea unguium: Secondary | ICD-10-CM | POA: Diagnosis not present

## 2015-12-11 DIAGNOSIS — M79676 Pain in unspecified toe(s): Secondary | ICD-10-CM

## 2015-12-11 NOTE — Progress Notes (Signed)
Subjective: 82 y.o. returns the office today for painful, elongated, thickened toenails which he cannot trim himself. Denies any redness or drainage around the nails. Denies any acute changes since last appointment and no new complaints today. Denies any systemic complaints such as fevers, chills, nausea, vomiting.   Objective: AAO 3, NAD DP/PT pulses palpable, CRT less than 3 seconds Nails hypertrophic, dystrophic, elongated, brittle, discolored 10. There is tenderness overlying the nails 1-5 bilaterally. There is no surrounding erythema or drainage along the nail sites. No open lesions or pre-ulcerative lesions are identified. No other areas of tenderness bilateral lower extremities. No overlying edema, erythema, increased warmth. No pain with calf compression, swelling, warmth, erythema.  Assessment: Patient presents with symptomatic onychomycosis  Plan: -Treatment options including alternatives, risks, complications were discussed -Nails sharply debrided 10 without complication/bleeding. -Discussed daily foot inspection. If there are any changes, to call the office immediately.  -Follow-up in 3 months or sooner if any problems are to arise. In the meantime, encouraged to call the office with any questions, concerns, changes symptoms.  Ovid CurdMatthew Ruairi Stutsman, DPM

## 2016-01-25 ENCOUNTER — Emergency Department (HOSPITAL_COMMUNITY)
Admission: EM | Admit: 2016-01-25 | Discharge: 2016-01-25 | Disposition: A | Discharge status: Home / Self Care | Payer: Medicare Other | Attending: Emergency Medicine | Admitting: Emergency Medicine

## 2016-01-25 ENCOUNTER — Encounter (HOSPITAL_COMMUNITY): Payer: Self-pay

## 2016-01-25 ENCOUNTER — Emergency Department (HOSPITAL_COMMUNITY): Payer: Medicare Other

## 2016-01-25 DIAGNOSIS — I10 Essential (primary) hypertension: Secondary | ICD-10-CM | POA: Insufficient documentation

## 2016-01-25 DIAGNOSIS — M1990 Unspecified osteoarthritis, unspecified site: Secondary | ICD-10-CM | POA: Diagnosis not present

## 2016-01-25 DIAGNOSIS — Z79899 Other long term (current) drug therapy: Secondary | ICD-10-CM | POA: Diagnosis not present

## 2016-01-25 DIAGNOSIS — Z7982 Long term (current) use of aspirin: Secondary | ICD-10-CM | POA: Insufficient documentation

## 2016-01-25 DIAGNOSIS — Z7901 Long term (current) use of anticoagulants: Secondary | ICD-10-CM | POA: Diagnosis not present

## 2016-01-25 DIAGNOSIS — E785 Hyperlipidemia, unspecified: Secondary | ICD-10-CM | POA: Diagnosis not present

## 2016-01-25 DIAGNOSIS — K219 Gastro-esophageal reflux disease without esophagitis: Secondary | ICD-10-CM | POA: Diagnosis not present

## 2016-01-25 DIAGNOSIS — Z87891 Personal history of nicotine dependence: Secondary | ICD-10-CM | POA: Diagnosis not present

## 2016-01-25 DIAGNOSIS — R05 Cough: Secondary | ICD-10-CM | POA: Insufficient documentation

## 2016-01-25 DIAGNOSIS — R9431 Abnormal electrocardiogram [ECG] [EKG]: Secondary | ICD-10-CM | POA: Diagnosis present

## 2016-01-25 DIAGNOSIS — R0981 Nasal congestion: Secondary | ICD-10-CM | POA: Insufficient documentation

## 2016-01-25 DIAGNOSIS — R079 Chest pain, unspecified: Secondary | ICD-10-CM | POA: Insufficient documentation

## 2016-01-25 DIAGNOSIS — R410 Disorientation, unspecified: Secondary | ICD-10-CM

## 2016-01-25 DIAGNOSIS — F0391 Unspecified dementia with behavioral disturbance: Secondary | ICD-10-CM

## 2016-01-25 LAB — CBC WITH DIFFERENTIAL/PLATELET
BASOS ABS: 0.1 10*3/uL (ref 0.0–0.1)
BASOS PCT: 1 %
EOS ABS: 0.2 10*3/uL (ref 0.0–0.7)
Eosinophils Relative: 2 %
HCT: 43.4 % (ref 39.0–52.0)
HEMOGLOBIN: 14.4 g/dL (ref 13.0–17.0)
LYMPHS ABS: 1.9 10*3/uL (ref 0.7–4.0)
Lymphocytes Relative: 20 %
MCH: 29.8 pg (ref 26.0–34.0)
MCHC: 33.2 g/dL (ref 30.0–36.0)
MCV: 89.9 fL (ref 78.0–100.0)
Monocytes Absolute: 1.6 10*3/uL — ABNORMAL HIGH (ref 0.1–1.0)
Monocytes Relative: 17 %
NEUTROS PCT: 60 %
Neutro Abs: 5.5 10*3/uL (ref 1.7–7.7)
Platelets: 215 10*3/uL (ref 150–400)
RBC: 4.83 MIL/uL (ref 4.22–5.81)
RDW: 12.8 % (ref 11.5–15.5)
WBC: 9.2 10*3/uL (ref 4.0–10.5)

## 2016-01-25 LAB — COMPREHENSIVE METABOLIC PANEL
ALT: 15 U/L — ABNORMAL LOW (ref 17–63)
ANION GAP: 14 (ref 5–15)
AST: 18 U/L (ref 15–41)
Albumin: 3.7 g/dL (ref 3.5–5.0)
Alkaline Phosphatase: 56 U/L (ref 38–126)
BUN: 12 mg/dL (ref 6–20)
CO2: 22 mmol/L (ref 22–32)
Calcium: 9.4 mg/dL (ref 8.9–10.3)
Chloride: 100 mmol/L — ABNORMAL LOW (ref 101–111)
Creatinine, Ser: 1.06 mg/dL (ref 0.61–1.24)
GFR calc non Af Amer: >60 mL/min (ref >60)
Glucose, Bld: 94 mg/dL (ref 65–99)
POTASSIUM: 4 mmol/L (ref 3.5–5.1)
Sodium: 136 mmol/L (ref 135–145)
Total Bilirubin: 0.8 mg/dL (ref 0.3–1.2)
Total Protein: 6.9 g/dL (ref 6.5–8.1)

## 2016-01-25 LAB — INFLUENZA PANEL BY PCR (TYPE A & B)
H1N1 flu by pcr: NOT DETECTED
INFLAPCR: NEGATIVE
INFLBPCR: NEGATIVE

## 2016-01-25 LAB — TROPONIN I

## 2016-01-25 LAB — BRAIN NATRIURETIC PEPTIDE: B Natriuretic Peptide: 32.4 pg/mL (ref 0.0–100.0)

## 2016-01-25 LAB — LIPASE, BLOOD: Lipase: 34 U/L (ref 11–51)

## 2016-01-25 MED ORDER — CLONAZEPAM 0.5 MG PO TABS
0.5000 mg | ORAL_TABLET | Freq: Once | ORAL | Status: AC
  Administered 2016-01-25: 0.5 mg via ORAL
  Filled 2016-01-25: qty 1

## 2016-01-25 NOTE — Discharge Instructions (Signed)
Dementia Dementia is a general term for problems with brain function. A person with dementia has memory loss and a hard time with at least one other brain function such as thinking, speaking, or problem solving. Dementia can affect social functioning, how you do your job, your mood, or your personality. The changes may be hidden for a long time. The earliest forms of this disease are usually not detected by family or friends. Dementia can be:  Irreversible.  Potentially reversible.  Partially reversible.  Progressive. This means it can get worse over time. CAUSES  Irreversible dementia causes may include:  Degeneration of brain cells (Alzheimer disease or Lewy body dementia).  Multiple small strokes (vascular dementia).  Infection (chronic meningitis or Creutzfeldt-Jakob disease).  Frontotemporal dementia. This affects younger people, age 40 to 70, compared to those who have Alzheimer disease.  Dementia associated with other disorders like Parkinson disease, Huntington disease, or HIV-associated dementia. Potentially or partially reversible dementia causes may include:  Medicines.  Metabolic causes such as excessive alcohol intake, vitamin B12 deficiency, or thyroid disease.  Masses or pressure in the brain such as a tumor, blood clot, or hydrocephalus. SIGNS AND SYMPTOMS  Symptoms are often hard to detect. Family members or coworkers may not notice them early in the disease process. Different people with dementia may have different symptoms. Symptoms can include:  A hard time with memory, especially recent memory. Long-term memory may not be impaired.  Asking the same question multiple times or forgetting something someone just said.  A hard time speaking your thoughts or finding certain words.  A hard time solving problems or performing familiar tasks (such as how to use a telephone).  Sudden changes in mood.  Changes in personality, especially increasing moodiness or  mistrust.  Depression.  A hard time understanding complex ideas that were never a problem in the past. DIAGNOSIS  There are no specific tests for dementia.   Your health care provider may recommend a thorough evaluation. This is because some forms of dementia can be reversible. The evaluation will likely include a physical exam and getting a detailed history from you and a family member. The history often gives the best clues and suggestions for a diagnosis.  Memory testing may be done. A detailed brain function evaluation called neuropsychologic testing may be helpful.  Lab tests and brain imaging (such as a CT scan or MRI scan) are sometimes important.  Sometimes observation and re-evaluation over time is very helpful. TREATMENT  Treatment depends on the cause.   If the problem is a vitamin deficiency, it may be helped or cured with supplements.  For dementias such as Alzheimer disease, medicines are available to stabilize or slow the course of the disease. There are no cures for this type of dementia.  Your health care provider can help direct you to groups, organizations, and other health care providers to help with decisions in the care of you or your loved one. HOME CARE INSTRUCTIONS The care of individuals with dementia is varied and dependent upon the progression of the dementia. The following suggestions are intended for the person living with, or caring for, the person with dementia.  Create a safe environment.  Remove the locks on bathroom doors to prevent the person from accidentally locking himself or herself in.  Use childproof latches on kitchen cabinets and any place where cleaning supplies, chemicals, or alcohol are kept.  Use childproof covers in unused electrical outlets.  Install childproof devices to keep doors and windows   secured.  Remove stove knobs or install safety knobs and an automatic shut-off on the stove.  Lower the temperature on water  heaters.  Label medicines and keep them locked up.  Secure knives, lighters, matches, power tools, and guns, and keep these items out of reach.  Keep the house free from clutter. Remove rugs or anything that might contribute to a fall.  Remove objects that might break and hurt the person.  Make sure lighting is good, both inside and outside.  Install grab rails as needed.  Use a monitoring device to alert you to falls or other needs for help.  Reduce confusion.  Keep familiar objects and people around.  Use night lights or dim lights at night.  Label items or areas.  Use reminders, notes, or directions for daily activities or tasks.  Keep a simple, consistent routine for waking, meals, bathing, dressing, and bedtime.  Create a calm, quiet environment.  Place large clocks and calendars prominently.  Display emergency numbers and home address near all telephones.  Use cues to establish different times of the day. An example is to open curtains to let the natural light in during the day.   Use effective communication.  Choose simple words and short sentences.  Use a gentle, calm tone of voice.  Be careful not to interrupt.  If the person is struggling to find a word or communicate a thought, try to provide the word or thought.  Ask one question at a time. Allow the person ample time to answer questions. Repeat the question again if the person does not respond.  Reduce nighttime restlessness.  Provide a comfortable bed.  Have a consistent nighttime routine.  Ensure a regular walking or physical activity schedule. Involve the person in daily activities as much as possible.  Limit napping during the day.  Limit caffeine.  Attend social events that stimulate rather than overwhelm the senses.  Encourage good nutrition and hydration.  Reduce distractions during meal times and snacks.  Avoid foods that are too hot or too cold.  Monitor chewing and swallowing  ability.  Continue with routine vision, hearing, dental, and medical screenings.  Give medicines only as directed by the health care provider.  Monitor driving abilities. Do not allow the person to drive when safe driving is no longer possible.  Register with an identification program which could provide location assistance in the event of a missing person situation. SEEK MEDICAL CARE IF:   New behavioral problems start such as moodiness, aggressiveness, or seeing things that are not there (hallucinations).  Any new problem with brain function happens. This includes problems with balance, speech, or falling a lot.  Problems with swallowing develop.  Any symptoms of other illness happen. Small changes or worsening in any aspect of brain function can be a sign that the illness is getting worse. It can also be a sign of another medical illness such as infection. Seeing a health care provider right away is important. SEEK IMMEDIATE MEDICAL CARE IF:   A fever develops.  New or worsened confusion develops.  New or worsened sleepiness develops.  Staying awake becomes hard to do.   This information is not intended to replace advice given to you by your health care provider. Make sure you discuss any questions you have with your health care provider.   Document Released: 05/18/2001 Document Revised: 12/13/2014 Document Reviewed: 04/19/2011 Elsevier Interactive Patient Education [employer]  Confusion Confusion is the inability to think with your usual  speed or clarity. Confusion may come on quickly or slowly over time. How quickly the confusion comes on depends on the cause. Confusion can be due to any number of causes. CAUSES   Concussion, head injury, or head trauma.  Seizures.  Stroke.  Fever.  Brain tumor.  Age related decreased brain function (dementia).  Heightened emotional states like rage or terror.  Mental illness in which the person loses the ability to  determine what is real and what is not (hallucinations).  Infections such as a urinary tract infection (UTI).  Toxic effects from alcohol, drugs, or prescription medicines.  Dehydration and an imbalance of salts in the body (electrolytes).  Lack of sleep.  Low blood sugar (diabetes).  Low levels of oxygen from conditions such as chronic lung disorders.  Drug interactions or other medicine side effects.  Nutritional deficiencies, especially niacin, thiamine, vitamin C, or vitamin B.  Sudden drop in body temperature (hypothermia).  Change in routine, such as when traveling or hospitalized. SIGNS AND SYMPTOMS  People often describe their thinking as cloudy or unclear when they are confused. Confusion can also include feeling disoriented. That means you are unaware of where or who you are. You may also not know what the date or time is. If confused, you may also have difficulty paying attention, remembering, and making decisions. Some people also act aggressively when they are confused.  DIAGNOSIS  The medical evaluation of confusion may include:  Blood and urine tests.  X-rays.  Brain and nervous system tests.  Analyzing your brain waves (electroencephalogram or EEG).  Magnetic resonance imaging (MRI) of your head.  Computed tomography (CT) scan of your head.  Mental status tests in which your health care provider may ask many questions. Some of these questions may seem silly or strange, but they are a very important test to help diagnose and treat confusion. TREATMENT  An admission to the hospital may not be needed, but a person with confusion should not be left alone. Stay with a family member or friend until the confusion clears. Avoid alcohol, pain relievers, or sedative drugs until you have fully recovered. Do not drive until directed by your health care provider. HOME CARE INSTRUCTIONS  What family and friends can do:  To find out if someone is confused, ask the person  to state his or her name, age, and the date. If the person is unsure or answers incorrectly, he or she is confused.  Always introduce yourself, no matter how well the person knows you.  Often remind the person of his or her location.  Place a calendar and clock near the confused person.  Help the person with his or her medicines. You may want to use a pill box, an alarm as a reminder, or give the person each dose as prescribed.  Talk about current events and plans for the day.  Try to keep the environment calm, quiet, and peaceful.  Make sure the person keeps follow-up visits with his or her health care provider. PREVENTION  Ways to prevent confusion:  Avoid alcohol.  Eat a balanced diet.  Get enough sleep.  Take medicine only as directed by your health care provider.  Do not become isolated. Spend time with other people and make plans for your days.  Keep careful watch on your blood sugar levels if you are diabetic. SEEK IMMEDIATE MEDICAL CARE IF:   You develop severe headaches, repeated vomiting, seizures, blackouts, or slurred speech.  There is increasing confusion, weakness, numbness,  restlessness, or personality changes.  You develop a loss of balance, have marked dizziness, feel uncoordinated, or fall.  You have delusions, hallucinations, or develop severe anxiety.  Your family members think you need to be rechecked.   This information is not intended to replace advice given to you by your health care provider. Make sure you discuss any questions you have with your health care provider.   Document Released: 12/30/2004 Document Revised: 12/13/2014 Document Reviewed: 12/28/2013 Elsevier Interactive Patient Education 2016 [employer] Inc[employer] Suspected Viral Infections A viral infection can be caused by different types of viruses.Most viral infections are not serious and resolve on their own. However, some infections may cause severe symptoms and may lead to further  complications. SYMPTOMS Viruses can frequently cause:  Minor sore throat.  Aches and pains.  Headaches.  Runny nose.  Different types of rashes.  Watery eyes.  Tiredness.  Cough.  Loss of appetite.  Gastrointestinal infections, resulting in nausea, vomiting, and diarrhea. These symptoms do not respond to antibiotics because the infection is not caused by bacteria. However, you might catch a bacterial infection following the viral infection. This is sometimes called a "superinfection." Symptoms of such a bacterial infection may include:  Worsening sore throat with pus and difficulty swallowing.  Swollen neck glands.  Chills and a high or persistent fever.  Severe headache.  Tenderness over the sinuses.  Persistent overall ill feeling (malaise), muscle aches, and tiredness (fatigue).  Persistent cough.  Yellow, green, or brown mucus production with coughing. HOME CARE INSTRUCTIONS   Only take over-the-counter or prescription medicines for pain, discomfort, diarrhea, or fever as directed by your caregiver.  Drink enough water and fluids to keep your urine clear or pale yellow. Sports drinks can provide valuable electrolytes, sugars, and hydration.  Get plenty of rest and maintain proper nutrition. Soups and broths with crackers or rice are fine. SEEK IMMEDIATE MEDICAL CARE IF:   You have severe headaches, shortness of breath, chest pain, neck pain, or an unusual rash.  You have uncontrolled vomiting, diarrhea, or you are unable to keep down fluids.  You or your child has an oral temperature above 102 F (38.9 C), not controlled by medicine.  Your baby is older than 3 months with a rectal temperature of 102 F (38.9 C) or higher.  Your baby is 12 months old or younger with a rectal temperature of 100.4 F (38 C) or higher. MAKE SURE YOU:   Understand these instructions.  Will watch your condition.  Will get help right away if you are not doing well or get  worse.   This information is not intended to replace advice given to you by your health care provider. Make sure you discuss any questions you have with your health care provider.   Document Released: 09/01/2005 Document Revised: 02/14/2012 Document Reviewed: 04/30/2015 Elsevier Interactive Patient Education [employer]

## 2016-01-25 NOTE — ED Provider Notes (Signed)
CSN: [id number]     Arrival date & time 01/25/16  1156 History   First MD Initiated Contact with Patient 01/25/16 1219     Chief Complaint  Patient presents with  . Abnormal ECG     (Consider location/radiation/quality/duration/timing/severity/associated sxs/prior Treatment) HPI Patient had some mild flulike symptoms about 2 days ago. His wife noted that there has been mild cough and nasal congestion. She thought he felt warm to the touch but did not have a thermometer to measure his temperature. He has not had vomiting or diarrhea. Yesterday evening the patient had been more agitated and confused than is typical for him. She reports that he was up and down and just could not figure out exactly what he wanted to do throughout the evening. One point he mentioned chest pain. He was brought into the [facility] walk-in clinic for assessment. Wife reports that they were told his EKG might be abnormal and referred to the emergency department for further assessment. Past Medical History  Diagnosis Date  . Esophageal stricture   . Hypertension   . Hyperlipemia   . GERD (gastroesophageal reflux disease)   . Dementia   . Arthritis    Past Surgical History  Procedure Laterality Date  . Esophageal stret    . Esophagogastroduodenoscopy  01/27/2012    Procedure: ESOPHAGOGASTRODUODENOSCOPY (EGD);  Surgeon: Jeryl Columbia, MD;  Location: [facility] ENDOSCOPY;  Service: Endoscopy;  Laterality: N/A;  . Esophagogastroduodenoscopy N/A 08/13/2013    Procedure: ESOPHAGOGASTRODUODENOSCOPY (EGD);  Surgeon: Winfield Cunas., MD;  Location: [facility] ENDOSCOPY;  Service: Endoscopy;  Laterality: N/A;  . Foreign body removal N/A 08/13/2013    Procedure: FOREIGN BODY REMOVAL;  Surgeon: Winfield Cunas., MD;  Location: [facility] ENDOSCOPY;  Service: Endoscopy;  Laterality: N/A;  . Cardioversion N/A 09/13/2013    Procedure: CARDIOVERSION;  Surgeon: Jacolyn Reedy, MD;  Location: [facility] ENDOSCOPY;  Service: Cardiovascular;  Laterality: N/A;    Family History  Problem Relation Age of Onset  . Alzheimer's disease Father   . Dementia Mother   . Cancer Daughter    Social History  Substance Use Topics  . Smoking status: Former Smoker -- 3.00 packs/day for 50 years    Types: Cigarettes    Quit date: 12/14/1998  . Smokeless tobacco: Never Used  . Alcohol Use: 1.2 oz/week    2 Glasses of wine per week     Comment: daily    Review of Systems  10 Systems reviewed and are negative for acute change except as noted in the HPI. All review systems is as per the patient's wife was primary caregiver.  Allergies  Ambien; Lipitor; and Statins  Home Medications   Prior to Admission medications   Medication Sig Start Date End Date Taking? Authorizing Provider  apixaban (ELIQUIS) 5 MG TABS tablet Take 1 tablet (5 mg total) by mouth 2 (two) times daily. 08/14/13  Yes Modena Jansky, MD  aspirin EC 81 MG tablet Take 81 mg by mouth daily as needed for fever.   Yes Historical Provider, MD  citalopram (CELEXA) 40 MG tablet Take 40 mg by mouth every morning.    Yes Historical Provider, MD  clonazePAM (KLONOPIN) 0.5 MG tablet Take 1 tablet (0.5 mg total) by mouth at bedtime. 03/26/14  Yes Penni Bombard, MD  donepezil (ARICEPT) 10 MG tablet Take 1 tablet (10 mg total) by mouth at bedtime. 03/26/14  Yes Vikram R Penumalli, MD  KRILL OIL PO Take 1 tablet by mouth daily.  Yes Historical Provider, MD  memantine (NAMENDA) 5 MG tablet Take 5 mg by mouth every morning.   Yes Historical Provider, MD  omeprazole (PRILOSEC) 20 MG capsule Take 1 capsule (20 mg total) by mouth 2 (two) times daily before a meal. Patient taking differently: Take 20 mg by mouth every morning.  08/14/13  Yes Modena Jansky, MD  Red Yeast Rice Extract (RED YEAST RICE PO) Take 2 capsules by mouth 2 (two) times daily.    Yes Historical Provider, MD  oxyCODONE-acetaminophen (PERCOCET/ROXICET) 5-325 MG per tablet Take 1 tablet by mouth every 6 (six) hours as needed for  moderate pain or severe pain. Patient not taking: Reported on 01/25/2016 11/06/14   Milton Ferguson, MD   BP 136/95 mmHg  Pulse 65  Temp(Src) 98.7 F (37.1 C) (Oral)  Resp 15  Ht 5' 6"  (1.676 m)  Wt 160 lb (72.576 kg)  BMI 25.84 kg/m2  SpO2 98% Physical Exam  Constitutional: He appears well-developed and well-nourished.  HENT:  Head: Normocephalic and atraumatic.  Eyes: EOM are normal. Pupils are equal, round, and reactive to light.  Neck: Neck supple.  Cardiovascular: Normal rate, regular rhythm, normal heart sounds and intact distal pulses.   Pulmonary/Chest: Effort normal and breath sounds normal.  Abdominal: Soft. Bowel sounds are normal. He exhibits no distension. There is no tenderness.  Musculoskeletal: Normal range of motion. He exhibits no edema.  Neurological: He is alert. He has normal strength. Coordination normal. GCS eye subscore is 4. GCS verbal subscore is 5. GCS motor subscore is 6.  Patient is alert and cheerful. He is cooperative. He is pleasantly confused.  Skin: Skin is warm, dry and intact.  Psychiatric: He has a normal mood and affect.    ED Course  Procedures (including critical care time) Labs Review Labs Reviewed  COMPREHENSIVE METABOLIC PANEL - Abnormal; Notable for the following:    Chloride 100 (*)    ALT 15 (*)    All other components within normal limits  CBC WITH DIFFERENTIAL/PLATELET - Abnormal; Notable for the following:    Monocytes Absolute 1.6 (*)    All other components within normal limits  LIPASE, BLOOD  BRAIN NATRIURETIC PEPTIDE  TROPONIN I  INFLUENZA PANEL BY PCR (TYPE A & B, H1N1)    Imaging Review Dg Chest 2 View  01/25/2016  CLINICAL DATA:  Pt. Coming from PCP office via [place] because of abnormal EKG during his visit. Pt. C/o chest pain last night, which has resolved. Pt. Given 38m ASA by wife. Pt. On eliquis for Afib. Pt. Vitals WDL. Pt. Hx of lewey body dementia, but wife is at bedside. Pt. Alert and oriented to place and  person. Hx HTN, dementia, former smoker EXAM: CHEST  2 VIEW COMPARISON:  08/13/2013.  Chest CT, 11/06/2014. FINDINGS: Cardiac silhouette is normal in size. No mediastinal or hilar masses or evidence of adenopathy. Lungs are clear.  No pleural effusion or pneumothorax. Bony thorax is demineralized but grossly intact. IMPRESSION: No acute cardiopulmonary disease. Electronically Signed   By: DLajean ManesM.D.   On: 01/25/2016 14:05   I have personally reviewed and evaluated these images and lab results as part of my medical decision-making.   EKG Interpretation   Date/Time:  Sunday January 25 2016 11:59:50 EST Ventricular Rate:  60 PR Interval:  211 QRS Duration: 90 QT Interval:  427 QTC Calculation: 427 R Axis:   65 Text Interpretation:  Sinus rhythm Nonspecific repol abnormality, diffuse  leads agree. no sig change  form prior Confirmed by Johnney Killian, MD, Jeannie Done  [phone]) on 01/25/2016 12:20:43 PM      MDM   Final diagnoses:  Episodic confusion  Dementia, with behavioral disturbance   Clinically patient has well appearance with stable vital signs. He is afebrile. He had been seen in his outpatient office and there was concern for abnormal EKG. Comparison EKG does not show specific abnormality from old. There is no acute ischemic appearance. Cardiac enzymes are negative. Patient's vital signs and heart and lung exam are normal. He is chronically on Eliquis. Suspicion was for possible viral illness exacerbating dementia. She is wife describes some viral type symptoms of cough and nasal congestion with subjective fever. At this time, the patient is afebrile and objectively does not have ill appearance. Diagnostic workup is normal. We were unable to obtain urinalysis, a collection kit is provided and the patient's wife will take a urine specimen to the family physician provider for analysis on the following day. At this time feel the patient is appropriate for discharge and return precautions  provided.    Charlesetta Shanks, MD 01/26/16 [phone]

## 2016-01-25 NOTE — ED Notes (Signed)
Pt. Coming from PCP office via [facility] because of abnormal EKG during his visit. Pt. C/o chest pain last night, which has resolved. Pt. Given  ASA by wife. Pt. On eliquis for Afib. Pt. Vitals WDL. Pt. Hx of lewey body dementia, but wife is at bedside. Pt. Alert and oriented to place and person.

## 2016-03-11 ENCOUNTER — Ambulatory Visit: Payer: Medicare Other | Admitting: Podiatry

## 2016-03-18 ENCOUNTER — Ambulatory Visit (INDEPENDENT_AMBULATORY_CARE_PROVIDER_SITE_OTHER): Payer: Medicare Other | Admitting: Podiatry

## 2016-03-18 ENCOUNTER — Encounter: Payer: Self-pay | Admitting: Podiatry

## 2016-03-18 DIAGNOSIS — M79676 Pain in unspecified toe(s): Secondary | ICD-10-CM

## 2016-03-18 DIAGNOSIS — B351 Tinea unguium: Secondary | ICD-10-CM | POA: Diagnosis not present

## 2016-03-21 NOTE — Progress Notes (Signed)
Patient ID: Jeremy Briggs, male   DOB: Dec 14, 1933, 82 y.o.   MRN: [id number]  Subjective: 82 y.o. returns the office today for painful, elongated, thickened toenails which he cannot trim himself. Denies any redness or drainage around the nails. Denies any acute changes since last appointment and no new complaints today. Denies any systemic complaints such as fevers, chills, nausea, vomiting.   Objective: AAO 3, NAD DP/PT pulses palpable, CRT less than 3 seconds Nails hypertrophic, dystrophic, elongated, brittle, discolored 10. There is tenderness overlying the nails 1-5 bilaterally. There is no surrounding erythema or drainage along the nail sites. No open lesions or pre-ulcerative lesions are identified. No other areas of tenderness bilateral lower extremities. No overlying edema, erythema, increased warmth. No pain with calf compression, swelling, warmth, erythema.  Assessment: Patient presents with symptomatic onychomycosis  Plan: -Treatment options including alternatives, risks, complications were discussed -Nails sharply debrided 10 without complication/bleeding. -Discussed daily foot inspection. If there are any changes, to call the office immediately.  -Follow-up in 3 months or sooner if any problems are to arise. In the meantime, encouraged to call the office with any questions, concerns, changes symptoms.  Ovid CurdMatthew Wagoner, DPM

## 2016-03-24 ENCOUNTER — Encounter (HOSPITAL_COMMUNITY)
Admission: EM | Disposition: A | Discharge status: Home / Self Care | Payer: Self-pay | Source: Home / Self Care | Attending: Emergency Medicine

## 2016-03-24 ENCOUNTER — Encounter (HOSPITAL_COMMUNITY): Payer: Self-pay | Admitting: Emergency Medicine

## 2016-03-24 ENCOUNTER — Emergency Department (HOSPITAL_COMMUNITY)
Admission: EM | Admit: 2016-03-24 | Discharge: 2016-03-24 | Disposition: A | Discharge status: Home / Self Care | Payer: Medicare Other | Source: Home / Self Care | Attending: Emergency Medicine | Admitting: Emergency Medicine

## 2016-03-24 ENCOUNTER — Emergency Department (HOSPITAL_COMMUNITY): Payer: Medicare Other | Admitting: Certified Registered"

## 2016-03-24 DIAGNOSIS — K222 Esophageal obstruction: Secondary | ICD-10-CM | POA: Diagnosis present

## 2016-03-24 DIAGNOSIS — M1990 Unspecified osteoarthritis, unspecified site: Secondary | ICD-10-CM | POA: Insufficient documentation

## 2016-03-24 DIAGNOSIS — I10 Essential (primary) hypertension: Secondary | ICD-10-CM

## 2016-03-24 DIAGNOSIS — X58XXXA Exposure to other specified factors, initial encounter: Secondary | ICD-10-CM | POA: Insufficient documentation

## 2016-03-24 DIAGNOSIS — F0280 Dementia in other diseases classified elsewhere without behavioral disturbance: Secondary | ICD-10-CM | POA: Diagnosis present

## 2016-03-24 DIAGNOSIS — Y998 Other external cause status: Secondary | ICD-10-CM

## 2016-03-24 DIAGNOSIS — T18128A Food in esophagus causing other injury, initial encounter: Secondary | ICD-10-CM | POA: Insufficient documentation

## 2016-03-24 DIAGNOSIS — Y9289 Other specified places as the place of occurrence of the external cause: Secondary | ICD-10-CM | POA: Insufficient documentation

## 2016-03-24 DIAGNOSIS — Z888 Allergy status to other drugs, medicaments and biological substances status: Secondary | ICD-10-CM

## 2016-03-24 DIAGNOSIS — Z8719 Personal history of other diseases of the digestive system: Secondary | ICD-10-CM

## 2016-03-24 DIAGNOSIS — Z7901 Long term (current) use of anticoagulants: Secondary | ICD-10-CM

## 2016-03-24 DIAGNOSIS — R1110 Vomiting, unspecified: Secondary | ICD-10-CM | POA: Diagnosis present

## 2016-03-24 DIAGNOSIS — J690 Pneumonitis due to inhalation of food and vomit: Secondary | ICD-10-CM | POA: Diagnosis not present

## 2016-03-24 DIAGNOSIS — I4892 Unspecified atrial flutter: Secondary | ICD-10-CM | POA: Diagnosis present

## 2016-03-24 DIAGNOSIS — Z79899 Other long term (current) drug therapy: Secondary | ICD-10-CM | POA: Insufficient documentation

## 2016-03-24 DIAGNOSIS — E785 Hyperlipidemia, unspecified: Secondary | ICD-10-CM | POA: Diagnosis present

## 2016-03-24 DIAGNOSIS — K210 Gastro-esophageal reflux disease with esophagitis: Secondary | ICD-10-CM | POA: Diagnosis present

## 2016-03-24 DIAGNOSIS — Z8639 Personal history of other endocrine, nutritional and metabolic disease: Secondary | ICD-10-CM | POA: Insufficient documentation

## 2016-03-24 DIAGNOSIS — R066 Hiccough: Secondary | ICD-10-CM | POA: Diagnosis present

## 2016-03-24 DIAGNOSIS — W19XXXA Unspecified fall, initial encounter: Secondary | ICD-10-CM | POA: Diagnosis present

## 2016-03-24 DIAGNOSIS — F0390 Unspecified dementia without behavioral disturbance: Secondary | ICD-10-CM

## 2016-03-24 DIAGNOSIS — Z66 Do not resuscitate: Secondary | ICD-10-CM | POA: Diagnosis present

## 2016-03-24 DIAGNOSIS — G3183 Dementia with Lewy bodies: Secondary | ICD-10-CM | POA: Diagnosis present

## 2016-03-24 DIAGNOSIS — Z87891 Personal history of nicotine dependence: Secondary | ICD-10-CM | POA: Insufficient documentation

## 2016-03-24 DIAGNOSIS — I4581 Long QT syndrome: Secondary | ICD-10-CM | POA: Diagnosis present

## 2016-03-24 DIAGNOSIS — R410 Disorientation, unspecified: Secondary | ICD-10-CM | POA: Diagnosis not present

## 2016-03-24 DIAGNOSIS — Y9389 Activity, other specified: Secondary | ICD-10-CM

## 2016-03-24 HISTORY — PX: ESOPHAGOGASTRODUODENOSCOPY: SHX5428

## 2016-03-24 HISTORY — PX: ESOPHAGOGASTRODUODENOSCOPY (EGD) WITH PROPOFOL: SHX5813

## 2016-03-24 SURGERY — EGD (ESOPHAGOGASTRODUODENOSCOPY)
Anesthesia: Moderate Sedation

## 2016-03-24 MED ORDER — FENTANYL CITRATE (PF) 100 MCG/2ML IJ SOLN
INTRAMUSCULAR | Status: AC
  Filled 2016-03-24: qty 2

## 2016-03-24 MED ORDER — FENTANYL CITRATE (PF) 100 MCG/2ML IJ SOLN
INTRAMUSCULAR | Status: DC | PRN
  Administered 2016-03-24: 25 ug via INTRAVENOUS

## 2016-03-24 MED ORDER — GLUCAGON HCL RDNA (DIAGNOSTIC) 1 MG IJ SOLR
1.0000 mg | Freq: Once | INTRAMUSCULAR | Status: AC
  Administered 2016-03-24: 1 mg via INTRAVENOUS
  Filled 2016-03-24: qty 1

## 2016-03-24 MED ORDER — LIDOCAINE HCL (CARDIAC) 20 MG/ML IV SOLN
INTRAVENOUS | Status: DC | PRN
  Administered 2016-03-24: 5 mL via INTRATRACHEAL

## 2016-03-24 MED ORDER — SODIUM CHLORIDE 0.9 % IV SOLN
INTRAVENOUS | Status: DC
  Administered 2016-03-24: 500 mL via INTRAVENOUS

## 2016-03-24 MED ORDER — OMEPRAZOLE 20 MG PO CPDR: 40.0000 mg | DELAYED_RELEASE_CAPSULE | Freq: Two times a day (BID) | ORAL | Status: AC

## 2016-03-24 MED ORDER — SUCCINYLCHOLINE CHLORIDE 20 MG/ML IJ SOLN
INTRAMUSCULAR | Status: DC | PRN
  Administered 2016-03-24: 100 mg via INTRAVENOUS

## 2016-03-24 MED ORDER — BUTAMBEN-TETRACAINE-BENZOCAINE 2-2-14 % EX AERO
INHALATION_SPRAY | CUTANEOUS | Status: DC | PRN
  Administered 2016-03-24: 2 via TOPICAL

## 2016-03-24 MED ORDER — MIDAZOLAM HCL 5 MG/ML IJ SOLN
INTRAMUSCULAR | Status: AC
  Filled 2016-03-24: qty 2

## 2016-03-24 MED ORDER — PROPOFOL 10 MG/ML IV BOLUS
INTRAVENOUS | Status: DC | PRN
  Administered 2016-03-24: 100 mg via INTRAVENOUS

## 2016-03-24 MED ORDER — MIDAZOLAM HCL 10 MG/2ML IJ SOLN
INTRAMUSCULAR | Status: DC | PRN
  Administered 2016-03-24: 2 mg via INTRAVENOUS
  Administered 2016-03-24: 1 mg via INTRAVENOUS

## 2016-03-24 NOTE — ED Notes (Addendum)
Pt wheeled to room from waiting room.

## 2016-03-24 NOTE — ED Notes (Signed)
Pt sts piece of steak in esophagus starting last night; pt sts some issues x months

## 2016-03-24 NOTE — Op Note (Signed)
[facility] Patient Name: Jeremy Briggs Procedure Date : 03/24/2016 MRN: [id number] Attending MD: Bernette Redbird , MD Date of Birth: [birth year] CSN: [id number] Age: 82 Admit Type: Outpatient Procedure:                Upper GI endoscopy Indications:              Foreign body in the esophagus Providers:                Bernette Redbird, MD, Dwain Sarna, RN, Harrington Challenger,                            Technician Referring MD:             Dr. Arvella Merles Medicines:                Fentanyl 25 mcg IV, Versed 3 mg IV; then, switched                            to General Anesthesia Complications:            No immediate complications. Estimated Blood Loss:     Estimated blood loss: none. Procedure:                Pre-Anesthesia Assessment:                           - Prior to the procedure, a History and Physical                            was performed, and patient medications and                            allergies were reviewed. The patient's tolerance of                            previous anesthesia was also reviewed (patient's                            wife reports he went into atrial fibrillation after                            his last endoscopic procedure). The risks and                            benefits of the procedure and the sedation options                            and risks were discussed with the patient's wife                            (patient himself has dementia). All questions were                            answered, and informed consent was obtained.  Prior Anticoagulants: The patient has taken Eliquis                            (apixaban), last dose was 1 day prior to procedure.                            ASA Grade Assessment: III - A patient with severe                            systemic disease. After reviewing the risks and                            benefits, the patient was deemed in satisfactory          condition to undergo the procedure.                           After obtaining informed consent, the endoscope was                            passed under direct vision. Throughout the                            procedure, the patient's blood pressure, pulse, and                            oxygen saturations were monitored continuously. The                            EG-2990I ([id number]) scope was introduced through the                            mouth, and advanced to the lower third of                            esophagus. I encountered fluid and somewhat                            amorphous food, which could not readily be pushed                            into the stomach. The patient was retching and                            regurgitating fluid and food particles and it was                            felt that he might not tolerate deeper standard                            sedation, so it was elected to withdraw the scope                            and  arrange for general anesthesia with                            endotracheal intubation. Fortunately, the                            Anesthesia staff were readily available so we were                            able to resume the procedure after a short while.                           At that point, the EG-2990I ([id number]) scope was                            introduced through the mouth, and advanced to the                            second part of duodenum. The upper GI endoscopy was                            accomplished without difficulty once general                            anethesia was administered.                           The patient tolerated the procedure somewhat poorly                            due to the patient's inability to tolerate                            conscious sedation, but then tolerated it well once                            general anesthesia was administered. Scope In: Scope Out: Findings:       The larynx was normal.      Food (meat) was found in the distal esophagus, essentially occluding the       esophageal lumen. Removal of food was accomplished in a piecemeal       fashion, initially with the [employer], and then the Rescue       basket. After removing several small pieces of food, I was then able to       push the remaining food bolus into the stomach, with transient smooth       resistance. The few remaining food particles were cleared from the       esophagus.      Following clearance of the esophagus, moderately severe esophagitis with       no active bleeding was found. The mucosa had erosive and hemorrhagic       changes, probably related to stasis (the food had been present almost 24       hours), although there may have been a reflux component as well. I did  not see a discrete fibrotic ring. With the edema, it was difficult to       tell if a mild stricture was present, but there was no significant       resistance to passage of the 10 mm endoscope to suggest a tight       stricture.      The entire examined stomach was normal.      The cardia and gastric fundus were normal on retroflexion.      The examined duodenum was normal. Impression:               - Normal larynx.                           - Food impaction in the distal esophagus. Removal                            was successful.                           - Moderately severe erosive esophagitis. No                            definite discrete stricture identified.                           - Normal stomach.                           - Normal examined duodenum. Moderate Sedation:      Moderate (conscious) sedation was administered by the endoscopy nurse       and supervised by the endoscopist. The following parameters were       monitored: oxygen saturation, heart rate, blood pressure, and response       to care. Total physician intraservice time was 15 minutes. Then, we       switched  to General Anesthesia by the Anesthesiology Department. Recommendation:           - Use Prilosec (omeprazole) 40 mg PO BID                            indefinitely (previously was on 20 mg BID).                           - Follow up with Dr. Bosie Clos in the office on Apr 09, 2016 at 2:45 pm.                           - Pureed diet and augmented water consumption diet                            for 2 days, then advance as tolerated to advance                            diet as tolerated.                           -  Post procedure medication instructions were                            provided to the patient. Procedure Code(s):        --- Professional ---                           [id number], Esophagogastroduodenoscopy, flexible,                            transoral; with removal of foreign body(s)                           G0500, Moderate sedation services provided by the                            same physician or other qualified health care                            professional performing a gastrointestinal                            endoscopic service that sedation supports,                            requiring the presence of an independent trained                            observer to assist in the monitoring of the                            patient's level of consciousness and physiological                            status; initial 15 minutes of intra-service time;                            patient age 58 years or older (additional time may                            be reported with [id number], as appropriate) Diagnosis Code(s):        --- Professional ---                           K20.8, Other esophagitis                           T18.108A, Unspecified foreign body in esophagus                            causing other injury, initial encounter CPT copyright 2016 American Medical Association. All rights reserved. The codes documented in this report are  preliminary and upon coder review may  be revised to meet current compliance requirements. Bernette Redbirdobert Wilmont Olund, MD  Bernette Redbirdobert Sayuri Rhames, MD 03/24/2016 5:40:17 PM This report has been signed electronically. Number of Addenda:  0 

## 2016-03-24 NOTE — Anesthesia Procedure Notes (Signed)
Procedure Name: Intubation Date/Time: 03/24/2016 4:44 PM Performed by: Jefm MilesENNIE, Zach Tietje E Pre-anesthesia Checklist: Patient identified, Emergency Drugs available, Suction available, Patient being monitored and Timeout performed Patient Re-evaluated:Patient Re-evaluated prior to inductionOxygen Delivery Method: Circle system utilized Preoxygenation: Pre-oxygenation with 100% oxygen Intubation Type: IV induction, Rapid sequence and Cricoid Pressure applied Laryngoscope Size: Mac and 4 Grade View: Grade I Tube type: Oral Tube size: 7.5 mm Number of attempts: 1 Airway Equipment and Method: Stylet Placement Confirmation: ETT inserted through vocal cords under direct vision,  positive ETCO2 and breath sounds checked- equal and bilateral Secured at: 22 cm Tube secured with: Tape Dental Injury: Teeth and Oropharynx as per pre-operative assessment

## 2016-03-24 NOTE — H&P (Signed)
Jeremy Briggs is an 82 y.o. male.   Chief Complaint: Food impacton HPI: longstanding h/o dysphagia assoc'd w/ distal esoph ring, h/o recurr fd imp's (most recently 08/2013).  Has been cautious w/ diet but was eating steak last night, and since then, has been unable to swallow, even his own secretions.  Water comes right back up.   There is also what sounds like a h/o esoph dysmotility--sometimes, just drinking coffee, it will come back up.  Pt has dementia.  Went into a fib (?a flutter) at time of last egd, per pt's wife.  He is maintained on Eliquis.    Past Medical History  Diagnosis Date  . Esophageal stricture   . Hypertension   . Hyperlipemia   . GERD (gastroesophageal reflux disease)   . Dementia   . Arthritis     Past Surgical History  Procedure Laterality Date  . Esophageal stret    . Esophagogastroduodenoscopy  01/27/2012    Procedure: ESOPHAGOGASTRODUODENOSCOPY (EGD);  Surgeon: Petra Kuba, MD;  Location: [facility] ENDOSCOPY;  Service: Endoscopy;  Laterality: N/A;  . Esophagogastroduodenoscopy N/A 08/13/2013    Procedure: ESOPHAGOGASTRODUODENOSCOPY (EGD);  Surgeon: Vertell Novak., MD;  Location: [facility] ENDOSCOPY;  Service: Endoscopy;  Laterality: N/A;  . Foreign body removal N/A 08/13/2013    Procedure: FOREIGN BODY REMOVAL;  Surgeon: Vertell Novak., MD;  Location: [facility] ENDOSCOPY;  Service: Endoscopy;  Laterality: N/A;  . Cardioversion N/A 09/13/2013    Procedure: CARDIOVERSION;  Surgeon: Othella Boyer, MD;  Location: [facility] ENDOSCOPY;  Service: Cardiovascular;  Laterality: N/A;    Family History  Problem Relation Age of Onset  . Alzheimer's disease Father   . Dementia Mother   . Cancer Daughter    Social History:  reports that he quit smoking about 17 years ago. His smoking use included Cigarettes. He has a 150 pack-year smoking history. He has never used smokeless tobacco. He reports that he drinks about 1.2 oz of alcohol per week. He reports that he does not use illicit  drugs.  Allergies:  Allergies  Allergen Reactions  . Ambien [Zolpidem Tartrate] Other (See Comments)    unknown  . Lipitor [Atorvastatin] Other (See Comments)    Goes crazy  . Statins     Goes crazy  . Zetia [Ezetimibe] Other (See Comments)    Angry, violent mannerisms    Medications Prior to Admission  Medication Sig Dispense Refill  . apixaban (ELIQUIS) 5 MG TABS tablet Take 1 tablet (5 mg total) by mouth 2 (two) times daily. 60 tablet 0  . citalopram (CELEXA) 40 MG tablet Take 40 mg by mouth every morning.     . clonazePAM (KLONOPIN) 0.5 MG tablet Take 1 tablet (0.5 mg total) by mouth at bedtime. (Patient taking differently: Take 1 mg by mouth at bedtime. ) 30 tablet 5  . donepezil (ARICEPT) 10 MG tablet Take 1 tablet (10 mg total) by mouth at bedtime. 30 tablet 12  . KRILL OIL PO Take 1 tablet by mouth daily.    . memantine (NAMENDA) 5 MG tablet Take 5 mg by mouth every morning.    Marland Kitchen omeprazole (PRILOSEC) 20 MG capsule Take 1 capsule (20 mg total) by mouth 2 (two) times daily before a meal. (Patient taking differently: Take 20 mg by mouth every morning. ) 60 capsule 0  . Red Yeast Rice Extract (RED YEAST RICE PO) Take 2 capsules by mouth 2 (two) times daily.     Marland Kitchen oxyCODONE-acetaminophen (PERCOCET/ROXICET) 5-325 MG  per tablet Take 1 tablet by mouth every 6 (six) hours as needed for moderate pain or severe pain. (Patient not taking: Reported on 01/25/2016) 20 tablet 0    No results found for this or any previous visit (from the past 48 hour(s)). No results found.  ROS  Blood pressure 149/108, pulse 80, temperature 98.2 F (36.8 C), temperature source Oral, resp. rate 20, SpO2 95 %.   Physical Exam Well-nourished, somewhat inappropriate white male looking younger than his age.  Anicteric, no pallor. Chest clear.  Heart w/out G/R/M--monitor shows NSR.  Abd somewhat firm--no mass or tenderness.  Assessment/Plan Food impaction--proceed to egd.    Risks reviewed w/ pt's  wife--increased risk, esp w/ respect to aspiration/asphyxiation, noted.  At present, I don't think it is necessary to prophyactically intubate pt.--risk of doing so may exceed benefit.  Florencia ReasonsBUCCINI,Rob Mciver V, MD 03/24/2016, 4:06 PM

## 2016-03-24 NOTE — ED Notes (Signed)
[facility]  All belongings with wife, consent at bedside.  A x 4, NAD, VSS on departure

## 2016-03-24 NOTE — Anesthesia Preprocedure Evaluation (Signed)
Anesthesia Evaluation  Patient identified by MRN, date of birth, ID band Patient confused    Reviewed: Allergy & Precautions, NPO status , Patient's Chart, lab work & pertinent test results  Airway Mallampati: II  TM Distance: >3 FB Neck ROM: Full    Dental  (+) Dental Advisory Given, Edentulous Upper, Edentulous Lower   Pulmonary former smoker,    Pulmonary exam normal breath sounds clear to auscultation       Cardiovascular hypertension, Pt. on medications Normal cardiovascular exam Rhythm:Regular Rate:Normal     Neuro/Psych lewy body dementia  negative psych ROS   GI/Hepatic Neg liver ROS, GERD  Medicated,Food impaction in esophagus   Endo/Other  negative endocrine ROS  Renal/GU negative Renal ROS     Musculoskeletal  (+) Arthritis , Osteoarthritis,    Abdominal   Peds  Hematology negative hematology ROS (+)   Anesthesia Other Findings Day of surgery medications reviewed with the patient.  Reproductive/Obstetrics                             Anesthesia Physical Anesthesia Plan  ASA: III  Anesthesia Plan: General   Post-op Pain Management:    Induction: Intravenous and Rapid sequence  Airway Management Planned: Oral ETT  Additional Equipment:   Intra-op Plan:   Post-operative Plan: Extubation in OR  Informed Consent: I have reviewed the patients History and Physical, chart, labs and discussed the procedure including the risks, benefits and alternatives for the proposed anesthesia with the patient or authorized representative who has indicated his/her understanding and acceptance.   Dental advisory given  Plan Discussed with: CRNA  Anesthesia Plan Comments: (Risks/benefits of general anesthesia discussed with patient including risk of damage to teeth, lips, gum, and tongue, nausea/vomiting, allergic reactions to medications, and the possibility of heart attack, stroke and  death.  All patient questions answered.  Patient wishes to proceed.  **Called to Endoscopy suite after failed EGD attempt under conscious sedation. Endoscopist now requesting anesthesia for EGD under GETA. MDA spoke with patient's wife obtaining consent and pertinent patient medical history prior to induction.  Will proceed with RSI and GETA.**)        Anesthesia Quick Evaluation

## 2016-03-24 NOTE — Transfer of Care (Signed)
Immediate Anesthesia Transfer of Care Note  Patient: Jeremy Briggs  Procedure(s) Performed: Procedure(s): ESOPHAGOGASTRODUODENOSCOPY (EGD) (N/A) ESOPHAGOGASTRODUODENOSCOPY (EGD) WITH PROPOFOL  Patient Location: PACU  Anesthesia Type:General  Level of Consciousness: lethargic and responds to stimulation  Airway & Oxygen Therapy: Patient Spontanous Breathing and Patient connected to nasal cannula oxygen  Post-op Assessment: Report given to RN  Post vital signs: Reviewed and stable  Last Vitals:  Filed Vitals:   03/24/16 1432 03/24/16 1549  BP: 140/59 149/108  Pulse: 60 80  Temp: 36.8 C   Resp: 16 20    Complications: No apparent anesthesia complications

## 2016-03-24 NOTE — ED Provider Notes (Signed)
CSN: [id number]     Arrival date & time 03/24/16  8657 History   First MD Initiated Contact with Patient 03/24/16 1117     Chief Complaint  Patient presents with  . Foreign Body    (Consider location/radiation/quality/duration/timing/severity/associated sxs/prior Treatment) Patient is a 82 y.o. male presenting with foreign body. The history is provided by the patient and the spouse.  Foreign Body Associated symptoms: trouble swallowing   Associated symptoms: no abdominal pain and no sore throat   82 y/o man with history of lewy body dementia and esophageal strictures presents after choking on a piece of food at a steak dinner last night and since then unable to tolerate liquids, solids, or secretions. He drooled into his pillowcase and bedsheet overnight and has been coughing up saliva regularly all morning. He does not have any abdominal or chest pain. He has been previously seen for foreign body obstruction of the esophagus in 2014 resolved with endoscopy.  Past Medical History  Diagnosis Date  . Esophageal stricture   . Hypertension   . Hyperlipemia   . GERD (gastroesophageal reflux disease)   . Dementia   . Arthritis    Past Surgical History  Procedure Laterality Date  . Esophageal stret    . Esophagogastroduodenoscopy  01/27/2012    Procedure: ESOPHAGOGASTRODUODENOSCOPY (EGD);  Surgeon: Petra Kuba, MD;  Location: [facility] ENDOSCOPY;  Service: Endoscopy;  Laterality: N/A;  . Esophagogastroduodenoscopy N/A 08/13/2013    Procedure: ESOPHAGOGASTRODUODENOSCOPY (EGD);  Surgeon: Vertell Novak., MD;  Location: [facility] ENDOSCOPY;  Service: Endoscopy;  Laterality: N/A;  . Foreign body removal N/A 08/13/2013    Procedure: FOREIGN BODY REMOVAL;  Surgeon: Vertell Novak., MD;  Location: [facility] ENDOSCOPY;  Service: Endoscopy;  Laterality: N/A;  . Cardioversion N/A 09/13/2013    Procedure: CARDIOVERSION;  Surgeon: Othella Boyer, MD;  Location: [facility] ENDOSCOPY;  Service: Cardiovascular;  Laterality:  N/A;   Family History  Problem Relation Age of Onset  . Alzheimer's disease Father   . Dementia Mother   . Cancer Daughter    Social History  Substance Use Topics  . Smoking status: Former Smoker -- 3.00 packs/day for 50 years    Types: Cigarettes    Quit date: 12/14/1998  . Smokeless tobacco: Never Used  . Alcohol Use: 1.2 oz/week    2 Glasses of wine per week     Comment: daily    Review of Systems  Constitutional: Negative for fever.  HENT: Positive for trouble swallowing. Negative for facial swelling and sore throat.   Respiratory: Negative for chest tightness and shortness of breath.   Cardiovascular: Negative for chest pain.  Gastrointestinal: Negative for abdominal pain.  Musculoskeletal: Negative for neck pain.  Neurological: Negative for headaches.  Psychiatric/Behavioral: Positive for confusion.      Allergies  Ambien; Lipitor; Statins; and Zetia  Home Medications   Prior to Admission medications   Medication Sig Start Date End Date Taking? Authorizing Provider  apixaban (ELIQUIS) 5 MG TABS tablet Take 1 tablet (5 mg total) by mouth 2 (two) times daily. 08/14/13  Yes Elease Etienne, MD  citalopram (CELEXA) 40 MG tablet Take 40 mg by mouth every morning.    Yes Historical Provider, MD  clonazePAM (KLONOPIN) 0.5 MG tablet Take 1 tablet (0.5 mg total) by mouth at bedtime. Patient taking differently: Take 1 mg by mouth at bedtime.  03/26/14  Yes Suanne Marker, MD  donepezil (ARICEPT) 10 MG tablet Take 1 tablet (10 mg total) by  mouth at bedtime. 03/26/14  Yes Vikram R Penumalli, MD  KRILL OIL PO Take 1 tablet by mouth daily.   Yes Historical Provider, MD  memantine (NAMENDA) 5 MG tablet Take 5 mg by mouth every morning.   Yes Historical Provider, MD  omeprazole (PRILOSEC) 20 MG capsule Take 1 capsule (20 mg total) by mouth 2 (two) times daily before a meal. Patient taking differently: Take 20 mg by mouth every morning.  08/14/13  Yes Elease EtienneAnand D Hongalgi, MD  Red  Yeast Draedyn Weidinger Extract (RED YEAST Cong Hightower PO) Take 2 capsules by mouth 2 (two) times daily.    Yes Historical Provider, MD  oxyCODONE-acetaminophen (PERCOCET/ROXICET) 5-325 MG per tablet Take 1 tablet by mouth every 6 (six) hours as needed for moderate pain or severe pain. Patient not taking: Reported on 01/25/2016 11/06/14   Bethann BerkshireJoseph Zammit, MD   BP 129/74 mmHg  Pulse 81  Temp(Src) 99 F (37.2 C) (Oral)  Resp 18  SpO2 96% Physical Exam  Constitutional: He appears well-developed. No distress.  HENT:  Mouth/Throat: Oropharynx is clear and moist. No oropharyngeal exudate.  Cardiovascular: Normal rate and regular rhythm.   Pulmonary/Chest: Effort normal and breath sounds normal. No stridor.  Abdominal: Soft. He exhibits no distension.  Lymphadenopathy:    He has no cervical adenopathy.  Psychiatric:  Some slow recall of events, mild confusion, pleasant demeanor.    ED Course  Procedures (including critical care time) Labs Review Labs Reviewed - No data to display  Imaging Review No results found. I have personally reviewed and evaluated these images and lab results as part of my medical decision-making.   EKG Interpretation None      MDM   Final diagnoses:  Food impaction of esophagus, initial encounter   History is consistent with a food impaction from something swallowed yesterday evening. Patient has a history of previous esophageal stricture and dysphagia with food impaction. He is currently unable to handle PO intake or his oral secretions. He is in no distress, VSS, and his lungs are clear to auscultation. GI was called and plan is for endoscopy for object removal later today. He will most likely be stable to return home later today after this procedure.   Fuller Planhristopher W Alzada Brazee, MD 03/24/16 1209  Melene Planan Floyd, DO 03/24/16 1311

## 2016-03-25 ENCOUNTER — Encounter (HOSPITAL_COMMUNITY): Payer: Self-pay | Admitting: Gastroenterology

## 2016-03-25 NOTE — Anesthesia Postprocedure Evaluation (Signed)
Anesthesia Post Note  Patient: Effie BerkshireRoger N Friesenhahn  Procedure(s) Performed: Procedure(s) (LRB): ESOPHAGOGASTRODUODENOSCOPY (EGD) (N/A) ESOPHAGOGASTRODUODENOSCOPY (EGD) WITH PROPOFOL  Patient location during evaluation: PACU Anesthesia Type: General Level of consciousness: patient cooperative Pain management: pain level controlled Vital Signs Assessment: post-procedure vital signs reviewed and stable Respiratory status: spontaneous breathing, nonlabored ventilation, respiratory function stable and patient connected to nasal cannula oxygen Cardiovascular status: blood pressure returned to baseline and stable Postop Assessment: no signs of nausea or vomiting Anesthetic complications: no    Last Vitals:  Filed Vitals:   03/24/16 1812 03/24/16 1815  BP: 123/65   Pulse: 65 62  Temp:  36.4 C  Resp: 17 53    Last Pain:  Filed Vitals:   03/24/16 1911  PainSc: Asleep                 Cecile HearingStephen Edward Turk

## 2016-03-26 ENCOUNTER — Emergency Department (HOSPITAL_COMMUNITY): Payer: Medicare Other

## 2016-03-26 ENCOUNTER — Encounter (HOSPITAL_COMMUNITY): Payer: Self-pay | Admitting: *Deleted

## 2016-03-26 ENCOUNTER — Inpatient Hospital Stay (HOSPITAL_COMMUNITY)
Admission: EM | Admit: 2016-03-26 | Discharge: 2016-03-29 | DRG: 178 | Disposition: A | Discharge status: Home Health | Payer: Medicare Other | Attending: Internal Medicine | Admitting: Internal Medicine

## 2016-03-26 DIAGNOSIS — R410 Disorientation, unspecified: Secondary | ICD-10-CM

## 2016-03-26 DIAGNOSIS — J690 Pneumonitis due to inhalation of food and vomit: Secondary | ICD-10-CM

## 2016-03-26 DIAGNOSIS — F0280 Dementia in other diseases classified elsewhere without behavioral disturbance: Secondary | ICD-10-CM | POA: Diagnosis present

## 2016-03-26 DIAGNOSIS — I10 Essential (primary) hypertension: Secondary | ICD-10-CM | POA: Diagnosis present

## 2016-03-26 DIAGNOSIS — D72829 Elevated white blood cell count, unspecified: Secondary | ICD-10-CM

## 2016-03-26 DIAGNOSIS — T18128A Food in esophagus causing other injury, initial encounter: Secondary | ICD-10-CM | POA: Diagnosis present

## 2016-03-26 DIAGNOSIS — W19XXXA Unspecified fall, initial encounter: Secondary | ICD-10-CM

## 2016-03-26 DIAGNOSIS — R1310 Dysphagia, unspecified: Secondary | ICD-10-CM

## 2016-03-26 DIAGNOSIS — R9431 Abnormal electrocardiogram [ECG] [EKG]: Secondary | ICD-10-CM | POA: Diagnosis present

## 2016-03-26 DIAGNOSIS — G3183 Dementia with Lewy bodies: Secondary | ICD-10-CM

## 2016-03-26 DIAGNOSIS — Z7901 Long term (current) use of anticoagulants: Secondary | ICD-10-CM

## 2016-03-26 DIAGNOSIS — I4892 Unspecified atrial flutter: Secondary | ICD-10-CM | POA: Diagnosis present

## 2016-03-26 LAB — CBC
HEMATOCRIT: 41.6 % (ref 39.0–52.0)
HEMOGLOBIN: 13.8 g/dL (ref 13.0–17.0)
MCH: 30.1 pg (ref 26.0–34.0)
MCHC: 33.2 g/dL (ref 30.0–36.0)
MCV: 90.6 fL (ref 78.0–100.0)
Platelets: 222 10*3/uL (ref 150–400)
RBC: 4.59 MIL/uL (ref 4.22–5.81)
RDW: 13.8 % (ref 11.5–15.5)
WBC: 16.5 10*3/uL — ABNORMAL HIGH (ref 4.0–10.5)

## 2016-03-26 LAB — COMPREHENSIVE METABOLIC PANEL
ALBUMIN: 3.7 g/dL (ref 3.5–5.0)
ALT: 22 U/L (ref 17–63)
ANION GAP: 12 (ref 5–15)
AST: 34 U/L (ref 15–41)
Alkaline Phosphatase: 51 U/L (ref 38–126)
BUN: 23 mg/dL — ABNORMAL HIGH (ref 6–20)
CHLORIDE: 102 mmol/L (ref 101–111)
CO2: 23 mmol/L (ref 22–32)
Calcium: 9.6 mg/dL (ref 8.9–10.3)
Creatinine, Ser: 1.2 mg/dL (ref 0.61–1.24)
GFR calc non Af Amer: 56 mL/min — ABNORMAL LOW (ref >60)
GLUCOSE: 116 mg/dL — AB (ref 65–99)
POTASSIUM: 3.8 mmol/L (ref 3.5–5.1)
SODIUM: 137 mmol/L (ref 135–145)
Total Bilirubin: 1.2 mg/dL (ref 0.3–1.2)
Total Protein: 7.4 g/dL (ref 6.5–8.1)

## 2016-03-26 LAB — URINALYSIS, ROUTINE W REFLEX MICROSCOPIC
Glucose, UA: NEGATIVE mg/dL
Ketones, ur: 15 mg/dL — AB
Leukocytes, UA: NEGATIVE
NITRITE: NEGATIVE
PH: 5.5 (ref 5.0–8.0)
Protein, ur: 30 mg/dL — AB
SPECIFIC GRAVITY, URINE: 1.033 — AB (ref 1.005–1.030)

## 2016-03-26 LAB — URINE MICROSCOPIC-ADD ON

## 2016-03-26 LAB — I-STAT TROPONIN, ED: TROPONIN I, POC: 0 ng/mL (ref 0.00–0.08)

## 2016-03-26 MED ORDER — IOPAMIDOL (ISOVUE-300) INJECTION 61%
100.0000 mL | Freq: Once | INTRAVENOUS | Status: AC | PRN
  Administered 2016-03-26: 100 mL via INTRAVENOUS

## 2016-03-26 MED ORDER — SODIUM CHLORIDE 0.9 % IV BOLUS (SEPSIS)
500.0000 mL | Freq: Once | INTRAVENOUS | Status: AC
  Administered 2016-03-26: 500 mL via INTRAVENOUS

## 2016-03-26 NOTE — ED Notes (Addendum)
Pt had endoscopy on Wed for "blockage close to stomach" per wife.  Since then he has been hiccoughing and vomiting.  Pt diaphoretic in triage.

## 2016-03-26 NOTE — ED Provider Notes (Signed)
CSN: [id number]     Arrival date & time 03/26/16  1715 History   First MD Initiated Contact with Patient 03/26/16 2113     Chief Complaint  Patient presents with  . Emesis  . Hiccups     (Consider location/radiation/quality/duration/timing/severity/associated sxs/prior Treatment) HPI Patient had upper endoscopy for esophageal impaction 2 days ago. Since that time the patient has had ongoing hiccups and vomiting. Complains of diffuse abdominal pain and distention. Unknown last bowel movement. Patient states he has not passed any gas today. No definite fever that patient has been diaphoretic. Patient at times tolerates fluids without vomiting. States that when he eats he gets sensation of food getting stuck in the chest. Currently denies any chest pain. No nausea or vomiting. Family states the patient has had increased confusion since procedure. Past Medical History  Diagnosis Date  . Esophageal stricture   . Hypertension   . Hyperlipemia   . GERD (gastroesophageal reflux disease)   . Dementia   . Arthritis    Past Surgical History  Procedure Laterality Date  . Esophageal stret    . Esophagogastroduodenoscopy  01/27/2012    Procedure: ESOPHAGOGASTRODUODENOSCOPY (EGD);  Surgeon: Petra Kuba, MD;  Location: [facility] ENDOSCOPY;  Service: Endoscopy;  Laterality: N/A;  . Esophagogastroduodenoscopy N/A 08/13/2013    Procedure: ESOPHAGOGASTRODUODENOSCOPY (EGD);  Surgeon: Vertell Novak., MD;  Location: [facility] ENDOSCOPY;  Service: Endoscopy;  Laterality: N/A;  . Foreign body removal N/A 08/13/2013    Procedure: FOREIGN BODY REMOVAL;  Surgeon: Vertell Novak., MD;  Location: [facility] ENDOSCOPY;  Service: Endoscopy;  Laterality: N/A;  . Cardioversion N/A 09/13/2013    Procedure: CARDIOVERSION;  Surgeon: Othella Boyer, MD;  Location: [facility] ENDOSCOPY;  Service: Cardiovascular;  Laterality: N/A;  . Esophagogastroduodenoscopy N/A 03/24/2016    Procedure: ESOPHAGOGASTRODUODENOSCOPY (EGD);  Surgeon: Bernette Redbird, MD;  Location: [facility] ENDOSCOPY;  Service: Endoscopy;  Laterality: N/A;  . Esophagogastroduodenoscopy (egd) with propofol  03/24/2016    Procedure: ESOPHAGOGASTRODUODENOSCOPY (EGD) WITH PROPOFOL;  Surgeon: Bernette Redbird, MD;  Location: [facility] ENDOSCOPY;  Service: Endoscopy;;   Family History  Problem Relation Age of Onset  . Alzheimer's disease Father   . Dementia Mother   . Cancer Daughter    Social History  Substance Use Topics  . Smoking status: Former Smoker -- 3.00 packs/day for 50 years    Types: Cigarettes    Quit date: 12/14/1998  . Smokeless tobacco: Never Used  . Alcohol Use: No     Comment: daily    Review of Systems  Constitutional: Positive for diaphoresis and appetite change. Negative for fever.  Respiratory: Negative for shortness of breath.   Cardiovascular: Positive for chest pain. Negative for palpitations and leg swelling.  Gastrointestinal: Positive for nausea, vomiting, abdominal pain, constipation and abdominal distention. Negative for diarrhea.  Genitourinary: Negative for dysuria, frequency, hematuria and flank pain.  Musculoskeletal: Negative for myalgias, back pain, neck pain and neck stiffness.  Skin: Negative for rash and wound.  Neurological: Negative for dizziness, weakness, light-headedness, numbness and headaches.  Psychiatric/Behavioral: Positive for confusion.  All other systems reviewed and are negative.     Allergies  Lipitor; Statins; Zetia; and Ambien  Home Medications   Prior to Admission medications   Medication Sig Start Date End Date Taking? Authorizing Provider  apixaban (ELIQUIS) 5 MG TABS tablet Take 1 tablet (5 mg total) by mouth 2 (two) times daily. 08/14/13  Yes Elease Etienne, MD  citalopram (CELEXA) 40 MG tablet Take 40 mg by  mouth every morning.    Yes Historical Provider, MD  clonazePAM (KLONOPIN) 0.5 MG tablet Take 1 tablet (0.5 mg total) by mouth at bedtime. Patient taking differently: Take 1 mg by mouth at bedtime.   03/26/14  Yes Suanne MarkerVikram R Penumalli, MD  donepezil (ARICEPT) 10 MG tablet Take 1 tablet (10 mg total) by mouth at bedtime. 03/26/14  Yes Vikram R Penumalli, MD  KRILL OIL PO Take 1 tablet by mouth daily.   Yes Historical Provider, MD  memantine (NAMENDA) 5 MG tablet Take 5 mg by mouth every morning.   Yes Historical Provider, MD  omeprazole (PRILOSEC) 20 MG capsule Take 2 capsules (40 mg total) by mouth 2 (two) times daily before a meal. 03/24/16  Yes Bernette Redbirdobert Buccini, MD  Red Yeast Rice Extract (RED YEAST RICE PO) Take 2 capsules by mouth 2 (two) times daily.    Yes Historical Provider, MD  oxyCODONE-acetaminophen (PERCOCET/ROXICET) 5-325 MG per tablet Take 1 tablet by mouth every 6 (six) hours as needed for moderate pain or severe pain. Patient not taking: Reported on 01/25/2016 11/06/14   Bethann BerkshireJoseph Zammit, MD   BP 116/59 mmHg  Pulse 67  Temp(Src) 98.4 F (36.9 C) (Oral)  Resp 19  Ht 5\' 7"  (1.702 m)  Wt 179 lb 14.4 oz (81.602 kg)  BMI 28.17 kg/m2  SpO2 100% Physical Exam  Constitutional: He appears well-developed and well-nourished. No distress.  HENT:  Head: Normocephalic and atraumatic.  Mouth/Throat: Oropharynx is clear and moist. No oropharyngeal exudate.  Eyes: EOM are normal. Pupils are equal, round, and reactive to light.  Neck: Normal range of motion. Neck supple.  Cardiovascular: Normal rate and regular rhythm.  Exam reveals no gallop and no friction rub.   No murmur heard. Pulmonary/Chest: Effort normal and breath sounds normal. No respiratory distress. He has no wheezes. He has no rales. He exhibits no tenderness.  Abdominal: He exhibits distension. There is tenderness.  High-pitched bowel sounds throughout. Patient has tympanic and distended abdomen with diffuse tenderness. No definite rebound or guarding.  Musculoskeletal: Normal range of motion. He exhibits no edema or tenderness.  No new lower extremity swelling or pain.  Neurological: He is alert.  Moves all extremities without  deficit. Sensation is fully intact.  Skin: Skin is warm and dry. No rash noted. No erythema.  Psychiatric: He has a normal mood and affect. His behavior is normal.  Nursing note and vitals reviewed.   ED Course  Procedures (including critical care time) Labs Review Labs Reviewed  COMPREHENSIVE METABOLIC PANEL - Abnormal; Notable for the following:    Glucose, Bld 116 (*)    BUN 23 (*)    GFR calc non Af Amer 56 (*)    All other components within normal limits  CBC - Abnormal; Notable for the following:    WBC 16.5 (*)    All other components within normal limits  URINALYSIS, ROUTINE W REFLEX MICROSCOPIC (NOT [facility]) - Abnormal; Notable for the following:    Color, Urine AMBER (*)    APPearance CLOUDY (*)    Specific Gravity, Urine 1.033 (*)    Hgb urine dipstick MODERATE (*)    Bilirubin Urine SMALL (*)    Ketones, ur 15 (*)    Protein, ur 30 (*)    All other components within normal limits  URINE MICROSCOPIC-ADD ON - Abnormal; Notable for the following:    Squamous Epithelial / LPF 0-5 (*)    Bacteria, UA RARE (*)    All other  components within normal limits  CK - Abnormal; Notable for the following:    Total CK 517 (*)    All other components within normal limits  COMPREHENSIVE METABOLIC PANEL - Abnormal; Notable for the following:    Glucose, Bld 105 (*)    Calcium 8.8 (*)    Total Protein 6.4 (*)    Albumin 3.0 (*)    Total Bilirubin 1.3 (*)    All other components within normal limits  CBC WITH DIFFERENTIAL/PLATELET - Abnormal; Notable for the following:    WBC 12.2 (*)    Neutro Abs 8.7 (*)    Monocytes Absolute 1.3 (*)    All other components within normal limits  CULTURE, EXPECTORATED SPUTUM-ASSESSMENT  GRAM STAIN  STREP PNEUMONIAE URINARY ANTIGEN  CBC  BASIC METABOLIC PANEL  I-STAT TROPOININ, ED    Imaging Review Ct Head Wo Contrast  03/27/2016  CLINICAL DATA:  Fall.  Confusion. EXAM: CT HEAD WITHOUT CONTRAST TECHNIQUE: Contiguous axial images were  obtained from the base of the skull through the vertex without intravenous contrast. COMPARISON:  Head CT 11/06/2014 FINDINGS: Generalized atrophy and mild chronic small vessel ischemia, stable from prior exam.No intracranial hemorrhage, mass effect, or midline shift. No hydrocephalus. The basilar cisterns are patent. No evidence of territorial infarct. No intracranial fluid collection. Calvarium is intact. The mastoid air cells are well aerated. Chronic appearing right maxillary sinus opacity. IMPRESSION: Stable atrophy and chronic small vessel ischemia. No CT findings of acute intracranial abnormality. Electronically Signed   By: Rubye Oaks M.D.   On: 03/27/2016 03:36   Ct Abdomen Pelvis W Contrast  03/26/2016  CLINICAL DATA:  Abdominal discomfort, recent upper endoscopy. EXAM: CT ABDOMEN AND PELVIS WITH CONTRAST TECHNIQUE: Multidetector CT imaging of the abdomen and pelvis was performed using the standard protocol following bolus administration of intravenous contrast. CONTRAST:  ISOVUE-300 IOPAMIDOL (ISOVUE-300) INJECTION 61% COMPARISON:  None. FINDINGS: Lower chest: There is a branching patchy nodular pattern in the LEFT lower lobe suggesting pneumonia or aspiration pneumonitis. Hepatobiliary: No focal hepatic lesion. No biliary duct dilatation. Gallbladder is normal. Common bile duct is normal. Pancreas: Pancreas is normal. No ductal dilatation. No pancreatic inflammation. Spleen: Normal spleen Adrenals/urinary tract: Adrenal glands are normal. Several low-density cortical lesions in the kidneys are too small to characterize. No hydronephrosis. Ureters bladder normal. Stomach/Bowel: Small hiatal hernia. The stomach, duodenum, small-bowel appendix, cecum are normal. The colon is normal. There are diverticula sigmoid colon. Vascular/Lymphatic: Abdominal aorta is normal caliber with atherosclerotic calcification. There is no retroperitoneal or periportal lymphadenopathy. No pelvic lymphadenopathy.  Calcified periportal lymph nodes. Reproductive: Prostate normal. Large fat filled RIGHT inguinal hernia. Other: No free fluid. Musculoskeletal: No aggressive osseous lesion. IMPRESSION: 1. LEFT lower lobe PNEUMONIA VERSUS ASPIRATION PNEUMONITIS. 2. Dense calcification aorta. 3. Sigmoid diverticulosis. 4. Large fat filled RIGHT inguinal hernia. Electronically Signed   By: Genevive Bi M.D.   On: 03/26/2016 23:54   Dg Chest Port 1 View  03/26/2016  CLINICAL DATA:  Chest and abdominal discomfort following upper endoscopy. Abdominal tenderness. Atrial fibrillation hypertension. EXAM: PORTABLE CHEST 1 VIEW COMPARISON:  01/25/2016 FINDINGS: The heart size and mediastinal contours are within normal limits. Atherosclerotic calcification of aortic arch noted. Both lungs are clear. No evidence of pneumothorax or pleural effusion. IMPRESSION: No active disease. Electronically Signed   By: Myles Rosenthal M.D.   On: 03/26/2016 22:01   I have personally reviewed and evaluated these images and lab results as part of my medical decision-making.   EKG  Interpretation None      MDM   Final diagnoses:  Aspiration pneumonia of left lower lobe, unspecified aspiration pneumonia type (HCC)  Confusion  Leukocytosis    Evidence of pneumonia on abdominal CT scan. Question aspiration versus community-acquired. Likely is the cause of the patient's vehicle. We'll cover for both. Discussed with Dr. Adela Glimpse and will admit    Loren Racer, MD 03/27/16 [phone]

## 2016-03-26 NOTE — ED Notes (Signed)
Family asked if pt could be checked to see if his adult depends were wet. Pt was undressed and given a urinal to give a urine sample. PT was clean and dry and after urinating placed on a clean pad with a clean brief.

## 2016-03-27 ENCOUNTER — Inpatient Hospital Stay (HOSPITAL_COMMUNITY): Payer: Medicare Other

## 2016-03-27 ENCOUNTER — Encounter (HOSPITAL_COMMUNITY): Payer: Self-pay | Admitting: Internal Medicine

## 2016-03-27 DIAGNOSIS — Z888 Allergy status to other drugs, medicaments and biological substances status: Secondary | ICD-10-CM | POA: Diagnosis not present

## 2016-03-27 DIAGNOSIS — R410 Disorientation, unspecified: Secondary | ICD-10-CM | POA: Diagnosis present

## 2016-03-27 DIAGNOSIS — Z7901 Long term (current) use of anticoagulants: Secondary | ICD-10-CM | POA: Diagnosis not present

## 2016-03-27 DIAGNOSIS — Z87891 Personal history of nicotine dependence: Secondary | ICD-10-CM | POA: Diagnosis not present

## 2016-03-27 DIAGNOSIS — K209 Esophagitis, unspecified: Secondary | ICD-10-CM | POA: Diagnosis not present

## 2016-03-27 DIAGNOSIS — I10 Essential (primary) hypertension: Secondary | ICD-10-CM | POA: Diagnosis present

## 2016-03-27 DIAGNOSIS — Z66 Do not resuscitate: Secondary | ICD-10-CM | POA: Diagnosis present

## 2016-03-27 DIAGNOSIS — I4581 Long QT syndrome: Secondary | ICD-10-CM | POA: Diagnosis present

## 2016-03-27 DIAGNOSIS — M1990 Unspecified osteoarthritis, unspecified site: Secondary | ICD-10-CM | POA: Diagnosis present

## 2016-03-27 DIAGNOSIS — K222 Esophageal obstruction: Secondary | ICD-10-CM | POA: Diagnosis present

## 2016-03-27 DIAGNOSIS — J690 Pneumonitis due to inhalation of food and vomit: Principal | ICD-10-CM

## 2016-03-27 DIAGNOSIS — W19XXXA Unspecified fall, initial encounter: Secondary | ICD-10-CM | POA: Diagnosis present

## 2016-03-27 DIAGNOSIS — F0280 Dementia in other diseases classified elsewhere without behavioral disturbance: Secondary | ICD-10-CM

## 2016-03-27 DIAGNOSIS — T18128A Food in esophagus causing other injury, initial encounter: Secondary | ICD-10-CM | POA: Diagnosis present

## 2016-03-27 DIAGNOSIS — I4892 Unspecified atrial flutter: Secondary | ICD-10-CM | POA: Diagnosis not present

## 2016-03-27 DIAGNOSIS — R1110 Vomiting, unspecified: Secondary | ICD-10-CM | POA: Diagnosis present

## 2016-03-27 DIAGNOSIS — G3183 Dementia with Lewy bodies: Secondary | ICD-10-CM | POA: Diagnosis not present

## 2016-03-27 DIAGNOSIS — R1310 Dysphagia, unspecified: Secondary | ICD-10-CM | POA: Diagnosis not present

## 2016-03-27 DIAGNOSIS — R9431 Abnormal electrocardiogram [ECG] [EKG]: Secondary | ICD-10-CM | POA: Diagnosis present

## 2016-03-27 DIAGNOSIS — Z79899 Other long term (current) drug therapy: Secondary | ICD-10-CM | POA: Diagnosis not present

## 2016-03-27 DIAGNOSIS — R066 Hiccough: Secondary | ICD-10-CM | POA: Diagnosis present

## 2016-03-27 DIAGNOSIS — K210 Gastro-esophageal reflux disease with esophagitis: Secondary | ICD-10-CM | POA: Diagnosis present

## 2016-03-27 DIAGNOSIS — E785 Hyperlipidemia, unspecified: Secondary | ICD-10-CM | POA: Diagnosis present

## 2016-03-27 LAB — CBC WITH DIFFERENTIAL/PLATELET
BASOS ABS: 0 10*3/uL (ref 0.0–0.1)
BASOS PCT: 0 %
EOS ABS: 0.3 10*3/uL (ref 0.0–0.7)
EOS PCT: 3 %
HCT: 39.9 % (ref 39.0–52.0)
HEMOGLOBIN: 13.1 g/dL (ref 13.0–17.0)
Lymphocytes Relative: 15 %
Lymphs Abs: 1.8 10*3/uL (ref 0.7–4.0)
MCH: 29.8 pg (ref 26.0–34.0)
MCHC: 32.8 g/dL (ref 30.0–36.0)
MCV: 90.7 fL (ref 78.0–100.0)
MONO ABS: 1.3 10*3/uL — AB (ref 0.1–1.0)
MONOS PCT: 11 %
Neutro Abs: 8.7 10*3/uL — ABNORMAL HIGH (ref 1.7–7.7)
Neutrophils Relative %: 71 %
PLATELETS: 205 10*3/uL (ref 150–400)
RBC: 4.4 MIL/uL (ref 4.22–5.81)
RDW: 13.6 % (ref 11.5–15.5)
WBC: 12.2 10*3/uL — ABNORMAL HIGH (ref 4.0–10.5)

## 2016-03-27 LAB — COMPREHENSIVE METABOLIC PANEL
ALBUMIN: 3 g/dL — AB (ref 3.5–5.0)
ALK PHOS: 48 U/L (ref 38–126)
ALT: 21 U/L (ref 17–63)
ANION GAP: 12 (ref 5–15)
AST: 31 U/L (ref 15–41)
BILIRUBIN TOTAL: 1.3 mg/dL — AB (ref 0.3–1.2)
BUN: 17 mg/dL (ref 6–20)
CALCIUM: 8.8 mg/dL — AB (ref 8.9–10.3)
CO2: 23 mmol/L (ref 22–32)
CREATININE: 0.97 mg/dL (ref 0.61–1.24)
Chloride: 103 mmol/L (ref 101–111)
GFR calc Af Amer: >60 mL/min (ref >60)
GFR calc non Af Amer: >60 mL/min (ref >60)
GLUCOSE: 105 mg/dL — AB (ref 65–99)
Potassium: 3.5 mmol/L (ref 3.5–5.1)
SODIUM: 138 mmol/L (ref 135–145)
TOTAL PROTEIN: 6.4 g/dL — AB (ref 6.5–8.1)

## 2016-03-27 LAB — CK: CK TOTAL: 517 U/L — AB (ref 49–397)

## 2016-03-27 LAB — STREP PNEUMONIAE URINARY ANTIGEN: Strep Pneumo Urinary Antigen: NEGATIVE

## 2016-03-27 MED ORDER — LEVOFLOXACIN IN D5W 750 MG/150ML IV SOLN
750.0000 mg | Freq: Once | INTRAVENOUS | Status: DC
  Filled 2016-03-27: qty 150

## 2016-03-27 MED ORDER — MEMANTINE HCL 5 MG PO TABS
5.0000 mg | ORAL_TABLET | Freq: Every day | ORAL | Status: DC
  Administered 2016-03-27 – 2016-03-29 (×4): 5 mg via ORAL
  Filled 2016-03-27 (×4): qty 1

## 2016-03-27 MED ORDER — PIPERACILLIN-TAZOBACTAM 3.375 G IVPB
3.3750 g | Freq: Three times a day (TID) | INTRAVENOUS | Status: DC
  Administered 2016-03-27 – 2016-03-29 (×6): 3.375 g via INTRAVENOUS
  Filled 2016-03-27 (×9): qty 50

## 2016-03-27 MED ORDER — CITALOPRAM HYDROBROMIDE 20 MG PO TABS
40.0000 mg | ORAL_TABLET | Freq: Every morning | ORAL | Status: DC
  Administered 2016-03-27 – 2016-03-28 (×2): 40 mg via ORAL
  Filled 2016-03-27 (×2): qty 2

## 2016-03-27 MED ORDER — DONEPEZIL HCL 10 MG PO TABS
10.0000 mg | ORAL_TABLET | Freq: Every day | ORAL | Status: DC
  Administered 2016-03-27 – 2016-03-28 (×2): 10 mg via ORAL
  Filled 2016-03-27 (×2): qty 1

## 2016-03-27 MED ORDER — SUCRALFATE 1 GM/10ML PO SUSP
1.0000 g | Freq: Three times a day (TID) | ORAL | Status: DC
  Administered 2016-03-27 – 2016-03-29 (×10): 1 g via ORAL
  Filled 2016-03-27 (×9): qty 10

## 2016-03-27 MED ORDER — SODIUM CHLORIDE 0.9 % IV SOLN: INTRAVENOUS | Status: AC

## 2016-03-27 MED ORDER — SIMETHICONE 40 MG/0.6ML PO SUSP
40.0000 mg | Freq: Once | ORAL | Status: AC
  Administered 2016-03-27: 40 mg via ORAL
  Filled 2016-03-27: qty 0.6

## 2016-03-27 MED ORDER — BACLOFEN 5 MG HALF TABLET
5.0000 mg | ORAL_TABLET | Freq: Two times a day (BID) | ORAL | Status: DC
  Administered 2016-03-27 – 2016-03-29 (×6): 5 mg via ORAL
  Filled 2016-03-27 (×5): qty 1

## 2016-03-27 MED ORDER — APIXABAN 5 MG PO TABS
5.0000 mg | ORAL_TABLET | Freq: Two times a day (BID) | ORAL | Status: DC
  Administered 2016-03-27 – 2016-03-28 (×4): 5 mg via ORAL
  Filled 2016-03-27 (×3): qty 1

## 2016-03-27 MED ORDER — CLONAZEPAM 1 MG PO TABS
1.0000 mg | ORAL_TABLET | Freq: Every day | ORAL | Status: DC
  Administered 2016-03-27 – 2016-03-28 (×2): 1 mg via ORAL
  Filled 2016-03-27 (×2): qty 1

## 2016-03-27 MED ORDER — PIPERACILLIN-TAZOBACTAM 3.375 G IVPB 30 MIN
3.3750 g | Freq: Once | INTRAVENOUS | Status: AC
  Administered 2016-03-27: 3.375 g via INTRAVENOUS
  Filled 2016-03-27: qty 50

## 2016-03-27 NOTE — H&P (Signed)
Jeremy Briggs:[id number] DOB: Aug 05, 1934 DOA: 03/26/2016   Referring MD Ranae Palms PCP: Johny Blamer, MD   Outpatient Specialists: [facility] [place], [facility]. Neurology at [facility] Patient coming from: home  Chief Complaint: fatigue, confusion, hiccups  HPI: Jeremy Briggs is a 82 y.o. 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  ON Wednesday undergone esophagyal dilation due to esophageal impaction. He had a fall on early Thurday morning while wandering the house he was found down by the wife unsure for how long was on the floor but since Thursday he developed hicckups and started to be doing worse.  Unable to tolerate food due to repeated hiccups. Aptietn is unable to provide hx it had to be obtained from the family  Family reports no fever at home but skin clammy, he has not vommited but been spitting up. He is only able to tolerate purred food at baseline. NO chest pain but tender to palpation. 4+ descriptors: Location, Quality, Severity, Duration, Timing, Context, modifying factors, associated signs/symptoms and/or status of 3+ chronic problems.   Regarding pertinent Chronic problems:  Hx of Lewy Body dementia followed by Neurology at [facility] at baseline does not recognize family and is pleasantly confused with episodes of sundowning.  Hx of PUD in the remote past followed by GI, Recurrent esophageal strictures over past 20 years followed by [facility].  Hx of Atrial Flutter undergoing cardioversion in October 2014 on chronic anticoagulation  IN ER: CK 517, UA no evidence of infection. Trop 0.00 Cr 1.2 WBC 16.5 CXR no active disease, initially patient appeared to have abdominal tenderness: CT abd: lower lobe pneumonia vs Aspiration Pneumonitis. Abdominal tenderness  has resolved  Hospitalist was called for admission for aspiration pneumonia  Review of Systems:    Pertinent positives include: fatigue,   Constitutional:  No weight loss, night sweats, Fevers, chills, weight loss  HEENT:  No headaches, Difficulty swallowing,Tooth/dental problems,Sore throat,  No sneezing, itching, ear ache, nasal congestion, post nasal drip,  Cardio-vascular:  No chest pain, Orthopnea, PND, anasarca, dizziness, palpitations.no Bilateral lower extremity swelling  GI:  No heartburn, indigestion, abdominal pain, nausea, vomiting, diarrhea, change in bowel habits, loss of appetite, melena, blood in stool, hematemesis Resp:  no shortness of breath at rest. No dyspnea on exertion, No excess mucus, no productive cough, No non-productive cough, No coughing up of blood.No change in color of mucus.No wheezing. Skin:  no rash or lesions. No jaundice GU:  no dysuria, change in color of urine, no urgency or frequency. No straining to urinate.  No flank pain.  Musculoskeletal:  No joint pain or no joint swelling. No decreased range of motion. No back pain.  Psych:  No change in mood or affect. No depression or anxiety. No memory loss.  Neuro: no localizing neurological complaints, no tingling, no weakness, no double vision, no gait abnormality, no slurred speech, no confusion  As per HPI otherwise 10 point review of systems negative.   Past Medical History: Past Medical History  Diagnosis Date  . Esophageal stricture   . Hypertension   . Hyperlipemia   . GERD (gastroesophageal reflux disease)   . Dementia   . Arthritis    Past Surgical History  Procedure Laterality Date  . Esophageal stret    . Esophagogastroduodenoscopy  01/27/2012  Procedure: ESOPHAGOGASTRODUODENOSCOPY (EGD);  Surgeon: Petra Kuba, MD;  Location: [facility] ENDOSCOPY;  Service: Endoscopy;  Laterality: N/A;  . Esophagogastroduodenoscopy N/A 08/13/2013    Procedure: ESOPHAGOGASTRODUODENOSCOPY (EGD);   Surgeon: Vertell Novak., MD;  Location: [facility] ENDOSCOPY;  Service: Endoscopy;  Laterality: N/A;  . Foreign body removal N/A 08/13/2013    Procedure: FOREIGN BODY REMOVAL;  Surgeon: Vertell Novak., MD;  Location: [facility] ENDOSCOPY;  Service: Endoscopy;  Laterality: N/A;  . Cardioversion N/A 09/13/2013    Procedure: CARDIOVERSION;  Surgeon: Othella Boyer, MD;  Location: [facility] ENDOSCOPY;  Service: Cardiovascular;  Laterality: N/A;  . Esophagogastroduodenoscopy N/A 03/24/2016    Procedure: ESOPHAGOGASTRODUODENOSCOPY (EGD);  Surgeon: Bernette Redbird, MD;  Location: [facility] ENDOSCOPY;  Service: Endoscopy;  Laterality: N/A;  . Esophagogastroduodenoscopy (egd) with propofol  03/24/2016    Procedure: ESOPHAGOGASTRODUODENOSCOPY (EGD) WITH PROPOFOL;  Surgeon: Bernette Redbird, MD;  Location: [facility] ENDOSCOPY;  Service: Endoscopy;;     Social History:  Ambulatory  Walker or independent Diet Purred Lives at home With family     reports that he quit smoking about 17 years ago. His smoking use included Cigarettes. He has a 150 pack-year smoking history. He has never used smokeless tobacco. He reports that he does not drink alcohol or use illicit drugs.  Allergies:   Allergies  Allergen Reactions  . Lipitor [Atorvastatin] Other (See Comments)    Goes crazy  . Statins Other (See Comments)    Goes crazy  . Zetia [Ezetimibe] Other (See Comments)    Angry, violent mannerisms  . Ambien [Zolpidem Tartrate] Other (See Comments)    unknown       Family History:  Family History  Problem Relation Age of Onset  . Alzheimer's disease Father   . Dementia Mother   . Cancer Daughter     Medications: Prior to Admission medications   Medication Sig Start Date End Date Taking? Authorizing Provider  apixaban (ELIQUIS) 5 MG TABS tablet Take 1 tablet (5 mg total) by mouth 2 (two) times daily. 08/14/13  Yes Elease Etienne, MD  citalopram (CELEXA) 40 MG tablet Take 40 mg by mouth every morning.    Yes Historical  Provider, MD  clonazePAM (KLONOPIN) 0.5 MG tablet Take 1 tablet (0.5 mg total) by mouth at bedtime. Patient taking differently: Take 1 mg by mouth at bedtime.  03/26/14  Yes Suanne Marker, MD  donepezil (ARICEPT) 10 MG tablet Take 1 tablet (10 mg total) by mouth at bedtime. 03/26/14  Yes Vikram R Penumalli, MD  KRILL OIL PO Take 1 tablet by mouth daily.   Yes Historical Provider, MD  memantine (NAMENDA) 5 MG tablet Take 5 mg by mouth every morning.   Yes Historical Provider, MD  omeprazole (PRILOSEC) 20 MG capsule Take 2 capsules (40 mg total) by mouth 2 (two) times daily before a meal. 03/24/16  Yes Bernette Redbird, MD  Red Yeast Rice Extract (RED YEAST RICE PO) Take 2 capsules by mouth 2 (two) times daily.    Yes Historical Provider, MD  oxyCODONE-acetaminophen (PERCOCET/ROXICET) 5-325 MG per tablet Take 1 tablet by mouth every 6 (six) hours as needed for moderate pain or severe pain. Patient not taking: Reported on 01/25/2016 11/06/14   Bethann Berkshire, MD    Physical Exam: Patient Vitals for the past 24 hrs:  BP Temp Temp src Pulse Resp SpO2 Height  03/26/16 2300 129/68 mmHg - - 73 16 93 % -  03/26/16 2230 145/65 mmHg - - 67 22 96 % -  03/26/16 2130 122/66 mmHg - - 71 16 94 % -  03/26/16 1742 132/64 mmHg 99.2 F (37.3 C) Oral 82 22 94 % 5\' 7"  (1.702 m)    1. General:  in No Acute distress, constant hiccups 2. Psychological: Alert but not Oriented 3. Head/ENT:    Dry Mucous Membranes                          Head Non traumatic, neck supple                           Poor Dentition 4. SKIN:  decreased Skin turgor,  Skin clean Dry and intact no rash 5. Heart: Regular rate and rhythm no Murmur, Rub or gallop 6. Lungs:  Clear to auscultation bilaterally, no wheezes or crackles   7. Abdomen: Soft, non-tender, Non distended 8. Lower extremities: no clubbing, cyanosis, or edema 9. Neurologically Grossly intact, moving all 4 extremities equally 10. MSK: Normal range of motion   body mass  index is unknown because there is no weight on file.  Labs on Admission:   Labs on Admission: I have personally reviewed following labs and imaging studies  CBC:  Recent Labs Lab 03/26/16 1800  WBC 16.5*  HGB 13.8  HCT 41.6  MCV 90.6  PLT 222   Basic Metabolic Panel:  Recent Labs Lab 03/26/16 1800  NA 137  K 3.8  CL 102  CO2 23  GLUCOSE 116*  BUN 23*  CREATININE 1.20  CALCIUM 9.6   GFR: CrCl cannot be calculated (Unknown ideal weight.). Liver Function Tests:  Recent Labs Lab 03/26/16 1800  AST 34  ALT 22  ALKPHOS 51  BILITOT 1.2  PROT 7.4  ALBUMIN 3.7   No results for input(s): LIPASE, AMYLASE in the last 168 hours. No results for input(s): AMMONIA in the last 168 hours. Coagulation Profile: No results for input(s): INR, PROTIME in the last 168 hours. Cardiac Enzymes: No results for input(s): CKTOTAL, CKMB, CKMBINDEX, TROPONINI in the last 168 hours. BNP (last 3 results) No results for input(s): PROBNP in the last 8760 hours. HbA1C: No results for input(s): HGBA1C in the last 72 hours. CBG: No results for input(s): GLUCAP in the last 168 hours. Lipid Profile: No results for input(s): CHOL, HDL, LDLCALC, TRIG, CHOLHDL, LDLDIRECT in the last 72 hours. Thyroid Function Tests: No results for input(s): TSH, T4TOTAL, FREET4, T3FREE, THYROIDAB in the last 72 hours. Anemia Panel: No results for input(s): VITAMINB12, FOLATE, FERRITIN, TIBC, IRON, RETICCTPCT in the last 72 hours. Urine analysis:    Component Value Date/Time   COLORURINE AMBER* 03/26/2016 2213   APPEARANCEUR CLOUDY* 03/26/2016 2213   LABSPEC 1.033* 03/26/2016 2213   PHURINE 5.5 03/26/2016 2213   GLUCOSEU NEGATIVE 03/26/2016 2213   HGBUR MODERATE* 03/26/2016 2213   BILIRUBINUR SMALL* 03/26/2016 2213   KETONESUR 15* 03/26/2016 2213   PROTEINUR 30* 03/26/2016 2213   UROBILINOGEN 1.0 08/13/2013 2024   NITRITE NEGATIVE 03/26/2016 2213   LEUKOCYTESUR NEGATIVE 03/26/2016 2213   Sepsis  Labs: @LABRCNTIP (procalcitonin:4,lacticidven:4) )No results found for this or any previous visit (from the past 240 hour(s)).      UA   no evidence of UTI  Lab Results  Component Value Date   HGBA1C 5.7* 08/13/2013    CrCl cannot be calculated (Unknown ideal weight.).  BNP (last 3 results) No results for input(s): PROBNP in the last 8760 hours.   ECG REPORT  Independently reviewed Rate:71  Rhythm: sinus rythm ST&T Change: No acute ischemic changes   QTC 517  There were no vitals filed for this visit.   Cultures: No results found for: SDES, SPECREQUEST, CULT, REPTSTATUS   Radiological Exams on Admission: Ct Abdomen Pelvis W Contrast  03/26/2016  CLINICAL DATA:  Abdominal discomfort, recent upper endoscopy. EXAM: CT ABDOMEN AND PELVIS WITH CONTRAST TECHNIQUE: Multidetector CT imaging of the abdomen and pelvis was performed using the standard protocol following bolus administration of intravenous contrast. CONTRAST:  100mL ISOVUE-300 IOPAMIDOL (ISOVUE-300) INJECTION 61% COMPARISON:  None. FINDINGS: Lower chest: There is a branching patchy nodular pattern in the LEFT lower lobe suggesting pneumonia or aspiration pneumonitis. Hepatobiliary: No focal hepatic lesion. No biliary duct dilatation. Gallbladder is normal. Common bile duct is normal. Pancreas: Pancreas is normal. No ductal dilatation. No pancreatic inflammation. Spleen: Normal spleen Adrenals/urinary tract: Adrenal glands are normal. Several low-density cortical lesions in the kidneys are too small to characterize. No hydronephrosis. Ureters bladder normal. Stomach/Bowel: Small hiatal hernia. The stomach, duodenum, small-bowel appendix, cecum are normal. The colon is normal. There are diverticula sigmoid colon. Vascular/Lymphatic: Abdominal aorta is normal caliber with atherosclerotic calcification. There is no retroperitoneal or periportal lymphadenopathy. No pelvic lymphadenopathy. Calcified periportal lymph nodes.  Reproductive: Prostate normal. Large fat filled RIGHT inguinal hernia. Other: No free fluid. Musculoskeletal: No aggressive osseous lesion. IMPRESSION: 1. LEFT lower lobe PNEUMONIA VERSUS ASPIRATION PNEUMONITIS. 2. Dense calcification aorta. 3. Sigmoid diverticulosis. 4. Large fat filled RIGHT inguinal hernia. Electronically Signed   By: Genevive BiStewart  Edmunds M.D.   On: 03/26/2016 23:54   Dg Chest Port 1 View  03/26/2016  CLINICAL DATA:  Chest and abdominal discomfort following upper endoscopy. Abdominal tenderness. Atrial fibrillation hypertension. EXAM: PORTABLE CHEST 1 VIEW COMPARISON:  01/25/2016 FINDINGS: The heart size and mediastinal contours are within normal limits. Atherosclerotic calcification of aortic arch noted. Both lungs are clear. No evidence of pneumothorax or pleural effusion. IMPRESSION: No active disease. Electronically Signed   By: Myles RosenthalJohn  Stahl M.D.   On: 03/26/2016 22:01    Chart has been reviewed    Assessment/Plan  82 yo M with hx of Dementia with Lewy bodies, esophageal stricture, atrial flutter admitted with Aspiration pneumonia  Present on Admission:  Persistent Hiccups - given prolonged QT will hold off on thorazine or Reglan, attempt to use simethicone for now. Peppermint water could also be attempted.  . Dementia with Lewy bodies - stable continue his home medications   . Food impaction of esophagus, S/P EGD 9/8 - has been able to resume some eating since dilatation but was limited secondary to recurrent hiccups . Hypertension stable continue medications  . Atrial flutter patient have had several falls we need to discuss with family or risks of continuing anticoagulation given dementia and increased risk for falling for now will hold off we will obtain CT of the head to evaluate for intracranial bleed in the setting of alcohol on anticoagulation  . Aspiration pneumonia (HCC) administer Zosyn speech pathology to evaluate for aspiration dysphasia diet  . Prolonged QT  interval - monitor on telemetry, avoid qt prolonging medications hold off on Thorazine for headache of secondary to QT prolongation. Repeat EKG in the morning  Other plan as per orders.  DVT prophylaxis:    SCD until CT head is done     Code Status:    DNR/DNI  as per family   Family Communication:   Family  at  Bedside  plan of care was discussed with  Daughter Elisha Cooksey [phone] , Wife Thierry Dobosz [phone],    Disposition Plan:   To home once workup is complete and patient is stable   Consults called: none  Admission status:  inpatient   obs    Level of care    medical floor      I have spent a total of 67 min on this admission   Jackquline Branca 03/27/2016, 3:23 AM    [facility]  Pager [phone]   after 2 AM please page floor coverage PA If 7AM-7PM, please contact the day team taking care of the patient  [email or web address]  Password [email or web address]

## 2016-03-27 NOTE — Progress Notes (Signed)
[facility] PROGRESS NOTE  Jeremy Briggs:[id number] DOB: [birth year] DOA: 03/26/2016  PCP: Johny Blamer, MD  Brief HPI: 82 year old Caucasian male with a past medical history of dementia of Lewy body, atrial flutter on anticoagulation, esophageal strictures, hypertension, who was seen recently on April 19 for food impaction. Patient underwent EGD. He was discharged home following this procedure. Patient presented with complaints of shortness of breath, fatigue, hiccups. He was found to have aspiration pneumonia and he was hospitalized for further management.  Past medical history:  Past Medical History  Diagnosis Date  . Esophageal stricture   . Hypertension   . Hyperlipemia   . GERD (gastroesophageal reflux disease)   . Dementia   . Arthritis     Consultants: Gastroenterology to be notified  Procedures:   EGD 4/19 done while he was in the ED  Antibiotics: Zosyn 4/21  Subjective: Patient is pleasantly confused. His daughter is at the bedside. Patient denies any pain. Hiccups on and off. Denies any nausea or vomiting. Due to the hiccups. He has been unable to tolerate orally since Friday.  Objective:  Vital Signs  Filed Vitals:   03/27/16 0400 03/27/16 0415 03/27/16 0430 03/27/16 0518  BP: 129/72 134/64 153/104 138/68  Pulse: 69 71 87 61  Temp:    98.2 F (36.8 C)  TempSrc:    Oral  Resp: Height:     (1.702 m)  Weight:    81.602 kg (179 lb 14.4 oz)  SpO2: 95% 96% 94% 98%    Intake/Output Summary (Last 24 hours) at 03/27/16 1057 Last data filed at 03/27/16 0730  Gross per 24 hour  Intake    640 ml  Output      0 ml  Net    640 ml   Filed Weights   03/27/16 0518  Weight: 81.602 kg (179 lb 14.4 oz)    General appearance: alert, distracted and no distress Resp: Diminished air entry at the bases. No wheezing or rhonchi. Few crackles at the bases. Cardio: regular rate and rhythm, S1, S2 normal, no murmur, click, rub or gallop GI:  soft, non-tender; bowel sounds normal; no masses,  no organomegaly Extremities: extremities normal, atraumatic, no cyanosis or edema Neurologic: Awake and alert. Presently confused. No facial asymmetry. Moving all his extremities. No obvious deficits noted.  Lab Results:  Data Reviewed: I have personally reviewed following labs and imaging studies  CBC:  Recent Labs Lab 03/26/16 1800 03/27/16 0614  WBC 16.5* 12.2*  NEUTROABS  --  8.7*  HGB 13.8 13.1  HCT 41.6 39.9  MCV 90.6 90.7  PLT 222 205   Basic Metabolic Panel:  Recent Labs Lab 03/26/16 1800 03/27/16 0614  NA 137 138  K 3.8 3.5  CL 102 103  CO2 23 23  GLUCOSE 116* 105*  BUN 23* 17  CREATININE 1.20 0.97  CALCIUM 9.6 8.8*   GFR: Estimated Creatinine Clearance: 63.1 mL/min (by C-G formula based on Cr of 0.97). Liver Function Tests:  Recent Labs Lab 03/26/16 1800 03/27/16 0614  AST 34 31  ALT 22 21  ALKPHOS 51 48  BILITOT 1.2 1.3*  PROT 7.4 6.4*  ALBUMIN 3.7 3.0*   Cardiac Enzymes:  Recent Labs Lab 03/26/16 0229  CKTOTAL 517*   Urine analysis:    Component Value Date/Time   COLORURINE AMBER* 03/26/2016 2213   APPEARANCEUR CLOUDY* 03/26/2016 2213   LABSPEC 1.033* 03/26/2016 2213   PHURINE 5.5 03/26/2016 2213   GLUCOSEU  NEGATIVE 03/26/2016 2213   HGBUR MODERATE* 03/26/2016 2213   BILIRUBINUR SMALL* 03/26/2016 2213   KETONESUR 15* 03/26/2016 2213   PROTEINUR 30* 03/26/2016 2213   UROBILINOGEN 1.0 08/13/2013 2024   NITRITE NEGATIVE 03/26/2016 2213   LEUKOCYTESUR NEGATIVE 03/26/2016 2213     Radiology Studies: Ct Head Wo Contrast  03/27/2016  CLINICAL DATA:  Fall.  Confusion. EXAM: CT HEAD WITHOUT CONTRAST TECHNIQUE: Contiguous axial images were obtained from the base of the skull through the vertex without intravenous contrast. COMPARISON:  Head CT 11/06/2014 FINDINGS: Generalized atrophy and mild chronic small vessel ischemia, stable from prior exam.No intracranial hemorrhage, mass effect,  or midline shift. No hydrocephalus. The basilar cisterns are patent. No evidence of territorial infarct. No intracranial fluid collection. Calvarium is intact. The mastoid air cells are well aerated. Chronic appearing right maxillary sinus opacity. IMPRESSION: Stable atrophy and chronic small vessel ischemia. No CT findings of acute intracranial abnormality. Electronically Signed   By: Rubye OaksMelanie  Ehinger M.D.   On: 03/27/2016 03:36   Ct Abdomen Pelvis W Contrast  03/26/2016  CLINICAL DATA:  Abdominal discomfort, recent upper endoscopy. EXAM: CT ABDOMEN AND PELVIS WITH CONTRAST TECHNIQUE: Multidetector CT imaging of the abdomen and pelvis was performed using the standard protocol following bolus administration of intravenous contrast. CONTRAST:  100mL ISOVUE-300 IOPAMIDOL (ISOVUE-300) INJECTION 61% COMPARISON:  None. FINDINGS: Lower chest: There is a branching patchy nodular pattern in the LEFT lower lobe suggesting pneumonia or aspiration pneumonitis. Hepatobiliary: No focal hepatic lesion. No biliary duct dilatation. Gallbladder is normal. Common bile duct is normal. Pancreas: Pancreas is normal. No ductal dilatation. No pancreatic inflammation. Spleen: Normal spleen Adrenals/urinary tract: Adrenal glands are normal. Several low-density cortical lesions in the kidneys are too small to characterize. No hydronephrosis. Ureters bladder normal. Stomach/Bowel: Small hiatal hernia. The stomach, duodenum, small-bowel appendix, cecum are normal. The colon is normal. There are diverticula sigmoid colon. Vascular/Lymphatic: Abdominal aorta is normal caliber with atherosclerotic calcification. There is no retroperitoneal or periportal lymphadenopathy. No pelvic lymphadenopathy. Calcified periportal lymph nodes. Reproductive: Prostate normal. Large fat filled RIGHT inguinal hernia. Other: No free fluid. Musculoskeletal: No aggressive osseous lesion. IMPRESSION: 1. LEFT lower lobe PNEUMONIA VERSUS ASPIRATION PNEUMONITIS. 2.  Dense calcification aorta. 3. Sigmoid diverticulosis. 4. Large fat filled RIGHT inguinal hernia. Electronically Signed   By: Genevive BiStewart  Edmunds M.D.   On: 03/26/2016 23:54   Dg Chest Port 1 View  03/26/2016  CLINICAL DATA:  Chest and abdominal discomfort following upper endoscopy. Abdominal tenderness. Atrial fibrillation hypertension. EXAM: PORTABLE CHEST 1 VIEW COMPARISON:  01/25/2016 FINDINGS: The heart size and mediastinal contours are within normal limits. Atherosclerotic calcification of aortic arch noted. Both lungs are clear. No evidence of pneumothorax or pleural effusion. IMPRESSION: No active disease. Electronically Signed   By: Myles RosenthalJohn  Stahl M.D.   On: 03/26/2016 22:01     Medications:  Scheduled: . baclofen  5 mg Oral BID  . citalopram  40 mg Oral q morning - 10a  . clonazePAM  1 mg Oral QHS  . donepezil  10 mg Oral QHS  . memantine  5 mg Oral Daily  . piperacillin-tazobactam (ZOSYN)  IV  3.375 g Intravenous Q8H  . simethicone  40 mg Oral Once  . sucralfate  1 g Oral TID WC & HS   Continuous: . sodium chloride     PRN:  Assessment/Plan:  Active Problems:   Atrial flutter   Hypertension   Food impaction of esophagus, S/P EGD 9/8   Current use of long  term anticoagulation   Dementia with Lewy bodies   Aspiration pneumonia (HCC)   Prolonged QT interval    Aspiration pneumonia This was in the setting of recent food impaction and hiccups. He is currently on Zosyn. WBC is improved this morning. Aspiration precautions. Consult speech therapy for swallow evaluation.  Persistent Hiccups Started after EGD on 4/19. Seems to be better this morning. Due to prolonged QT interval noted on EKG, he could not be given Thorazine or Reglan. We can try low-dose baclofen.  Recent food impaction  Status post EGD 4/19. Esophagitis noted on that EGD. Carafate will be ordered.   Essential home Hypertension Stable. Continue home medications.  History of atrial flutter Stable. Rate is  well controlled. He is on Apixaban at home. This will need to be continued.   Prolonged QT interval Monitor on telemetry. Avoid qt prolonging medications. Repeat EKG in the morning  Dementia Appears to be at baseline. Continue home medications.  DVT Prophylaxis: On Apixaban    Code Status: DNR  Family Communication: Discussed with patient and daughter  Disposition Plan: Await improvement.    LOS: 0 days   [facility]  [facility] Pager [phone] 03/27/2016, 10:57 AM  If 7PM-7AM, please contact night-coverage at [email or web address], password [facility]

## 2016-03-27 NOTE — Progress Notes (Signed)
SLP Cancellation Note  Patient Details Name: Effie BerkshireRoger N Gressman MRN: [id number] DOB: 21-Jan-1934   Cancelled treatment:       Reason Eval/Treat Not Completed: Family declined x2; pt lethargic; stated he "ate lunch tray okay, but coughs intermittently with liquids when using straw; hx of esophageal issues (frequent EGDs); on D1/thin currently; discussed eliminating straw from meals to see if coughing eliminated.   Aaliya Maultsby,PAT, M.S., CCC-SLP 03/27/2016, 3:41 PM

## 2016-03-27 NOTE — Evaluation (Signed)
Physical Therapy Evaluation and Discharge Patient Details Name: Jeremy Briggs MRN: [id number] DOB: 1934/10/29 Today's Date: 03/27/2016   History of Present Illness  Jeremy Briggs is a 82 y.o. male Presented with incr confusion,  recurrent hiccups,  unbale to tolerate PO. ON Wednesday undergone esophagyal dilation due to esophageal impaction. He had a fall on early Thurday morning while wandering the house. 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 He is near his RECENT baseline (he has had a recent decline in function/balance). She agrees they can manage his mobility needs at home (at most light min assist). She is concerned re: pt's wandering at night (wife does not always hear him get up; how he fell this time). Discussed option of "baby monitor" and aide at night to allow wife to sleep/rest. All acute PT education has been completed and the patient/family has no further questions. See below for any follow-up Physical Therapy or equipment needs. PT is signing off in anticipation of patient's discharge home soon (?today per MD note). Thank you for this referral.  Recommend Case Management consult re: availability of aide services (has [employer] benefits vs private pay).      Follow Up Recommendations Home health PT;Supervision/Assistance - 24 hour (aide for nights would be ideal; pt wanders freq)    Equipment Recommendations  None recommended by PT    Recommendations for Other Services       Precautions / Restrictions Precautions Precautions: Fall Precaution Comments: h/o falls "not an unusual occurrence" per dtr Restrictions Weight Bearing Restrictions: No      Mobility  Bed Mobility Overal bed mobility: Needs Assistance Bed Mobility: Rolling;Sidelying to Sit;Sit to Supine Rolling: Independent Sidelying to sit: Min guard   Sit to supine: Min guard   General bed mobility  comments: HOB flat, no rail; near need for assist to come to sit and to raise legs into bed; ultimately he did himself wiht incr time andeffort  Transfers Overall transfer level: Needs assistance Equipment used: Rolling walker (2 wheeled) Transfers: Sit to/from Stand Sit to Stand: Min assist;Min guard         General transfer comment: x4; 1st attempt very unsteady with min assist to recover; improved with repetition (and incr alertness)  Ambulation/Gait Ambulation/Gait assistance: Min assist Ambulation Distance (Feet): 15 Feet (toileted; 15; 15) Assistive device: Rolling walker (2 wheeled) Gait Pattern/deviations: Step-through pattern;Decreased stride length;Shuffle;Trunk flexed   Gait velocity interpretation: Below normal speed for age/gender General Gait Details: focused on in room maneuvering of RW (pt needs assist, including reminding him not to carry RW)  Stairs            Wheelchair Mobility    Modified Rankin (Stroke Patients Only)       Balance Overall balance assessment: History of Falls;Needs assistance   Sitting balance-Leahy Scale: Fair     Standing balance support: No upper extremity supported Standing balance-Leahy Scale: Poor Standing balance comment: progressed to fair with repeition                             Pertinent Vitals/Pain Pain Assessment: No/denies pain    Home Living Family/patient expects to be discharged to:: Private residence Living Arrangements: Spouse/significant other Available Help at Discharge: Family;Available 24 hours/day;Other (Comment) (Mon-Fri goes to day "enrichment pgm") Type of Home: House Home Access: Ramped entrance     Home Layout: One  level Home Equipment: Walker - 2 wheels;Wheelchair - manual      Prior Function Level of Independence: Needs assistance   Gait / Transfers Assistance Needed: inside no device; assist always if goes out and uses cane vs RW  ADL's / Homemaking Assistance Needed:  assist with bathing, dressing, and toileting        Hand Dominance   Dominant Hand: Right    Extremity/Trunk Assessment   Upper Extremity Assessment: Generalized weakness           Lower Extremity Assessment: Generalized weakness      Cervical / Trunk Assessment: Normal  Communication   Communication: HOH  Cognition Arousal/Alertness:  (initially drowsy (awakened); incr to alert) Behavior During Therapy: WFL for tasks assessed/performed Overall Cognitive Status: Impaired/Different from baseline Area of Impairment: Orientation;Memory;Following commands;Safety/judgement Orientation Level: Place;Time;Situation (per daughter was oriented earlier)   Memory: Decreased short-term memory Following Commands: Follows one step commands consistently Safety/Judgement: Decreased awareness of safety;Decreased awareness of deficits     General Comments: confusion worse than baseline per dtr    General Comments      Exercises        Assessment/Plan    PT Assessment All further PT needs can be met in the next venue of care  PT Diagnosis Generalized weakness;Difficulty walking   PT Problem List Decreased strength;Decreased balance;Decreased mobility;Decreased knowledge of use of DME;Decreased cognition;Decreased safety awareness  PT Treatment Interventions     PT Goals (Current goals can be found in the Care Plan section) Acute Rehab PT Goals Patient Stated Goal: daughter wants to take pt home and improve his safety PT Goal Formulation: All assessment and education complete, DC therapy    Frequency     Barriers to discharge        Co-evaluation               End of Session Equipment Utilized During Treatment: Gait belt Activity Tolerance: Patient tolerated treatment well Patient left: in bed;with call bell/phone within reach;with bed alarm set;with family/visitor present Nurse Communication: Mobility status         Time: 5465-0354 PT Time Calculation  (min) (ACUTE ONLY): 34 min   Charges:   PT Evaluation $PT Eval Moderate Complexity: 1 Procedure PT Treatments $Gait Training: 8-22 mins   PT G Codes:        Jeremy Briggs 04/22/16, 9:00 AM  Pager [phone]

## 2016-03-27 NOTE — Progress Notes (Signed)
Pharmacy Antibiotic Note  Effie BerkshireRoger N Whetstone is a 82 y.o. male admitted on 03/26/2016 with aspiration pneumonia.  Pharmacy has been consulted for Zosyn dosing.  Plan: Zosyn 3.375g IV q8h (4 hour infusion).  Height: 5\' 7"  (170.2 cm) Weight: 179 lb 14.4 oz (81.602 kg) IBW/kg (Calculated) : 66.1  Temp (24hrs), Avg:98.8 F (37.1 C), Min:98.2 F (36.8 C), Max:99.2 F (37.3 C)   Recent Labs Lab 03/26/16 1800  WBC 16.5*  CREATININE 1.20    Estimated Creatinine Clearance: 51 mL/min (by C-G formula based on Cr of 1.2).    Allergies  Allergen Reactions  . Lipitor [Atorvastatin] Other (See Comments)    Goes crazy  . Statins Other (See Comments)    Goes crazy  . Zetia [Ezetimibe] Other (See Comments)    Angry, violent mannerisms  . Ambien [Zolpidem Tartrate] Other (See Comments)    unknown     Thank you for allowing pharmacy to be a part of this patient's care.  Vernard GamblesVeronda Juvencio Verdi, PharmD, BCPS  03/27/2016 5:56 AM

## 2016-03-28 DIAGNOSIS — K209 Esophagitis, unspecified: Secondary | ICD-10-CM

## 2016-03-28 LAB — CBC
HCT: 41.9 % (ref 39.0–52.0)
HEMOGLOBIN: 13.7 g/dL (ref 13.0–17.0)
MCH: 29.5 pg (ref 26.0–34.0)
MCHC: 32.7 g/dL (ref 30.0–36.0)
MCV: 90.1 fL (ref 78.0–100.0)
Platelets: 226 10*3/uL (ref 150–400)
RBC: 4.65 MIL/uL (ref 4.22–5.81)
RDW: 13.4 % (ref 11.5–15.5)
WBC: 9.3 10*3/uL (ref 4.0–10.5)

## 2016-03-28 LAB — BASIC METABOLIC PANEL
Anion gap: 11 (ref 5–15)
BUN: 10 mg/dL (ref 6–20)
CHLORIDE: 106 mmol/L (ref 101–111)
CO2: 23 mmol/L (ref 22–32)
Calcium: 8.9 mg/dL (ref 8.9–10.3)
Creatinine, Ser: 0.96 mg/dL (ref 0.61–1.24)
GFR calc Af Amer: >60 mL/min (ref >60)
GFR calc non Af Amer: >60 mL/min (ref >60)
GLUCOSE: 105 mg/dL — AB (ref 65–99)
POTASSIUM: 3.5 mmol/L (ref 3.5–5.1)
Sodium: 140 mmol/L (ref 135–145)

## 2016-03-28 MED ORDER — CITALOPRAM HYDROBROMIDE 20 MG PO TABS
20.0000 mg | ORAL_TABLET | Freq: Every morning | ORAL | Status: DC
  Administered 2016-03-29: 20 mg via ORAL
  Filled 2016-03-28: qty 1

## 2016-03-28 MED ORDER — POTASSIUM CHLORIDE CRYS ER 20 MEQ PO TBCR
20.0000 meq | EXTENDED_RELEASE_TABLET | Freq: Once | ORAL | Status: AC
  Administered 2016-03-28: 20 meq via ORAL
  Filled 2016-03-28: qty 1

## 2016-03-28 NOTE — Progress Notes (Signed)
Utilization review completed.  

## 2016-03-28 NOTE — Progress Notes (Signed)
Occupational Therapy Evaluation Patient Details Name: Jeremy Briggs MRN: [id number] DOB: 1934/09/25 Today's Date: 03/28/2016    History of Present Illness Jeremy Briggs is a 82 y.o. male Presented with incr confusion,  recurrent hiccups,  unbale to tolerate PO. ON Wednesday undergone esophagyal dilation due to esophageal impaction. He had a fall on early Thurday morning while wandering the house. 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 Pt will benefit from continued acute OT to address the below listed deficits and maximize independence with BADLs prior to d/c to venue below. PTA pt was mod I to independent with toileting and UB/LB bathing; max A with UB/LB dressing. Pt is currently min guard to min A with toileting, UB/LB bathing; max A with UB/LB dressing. Session details below.      Follow Up Recommendations  Home health OT;Supervision/Assistance - 24 hour    Equipment Recommendations  3 in 1 bedside comode    Recommendations for Other Services       Precautions / Restrictions Precautions Precautions: Fall Precaution Comments: h/o falls "not an unusual occurrence" per dtr Restrictions Weight Bearing Restrictions: No      Mobility Bed Mobility Overal bed mobility: Needs Assistance Bed Mobility: Supine to Sit     Supine to sit: Mod assist;HOB elevated     General bed mobility comments: assist to power up trunk to sitting EOB  Transfers Overall transfer level: Needs assistance Equipment used: Rolling walker (2 wheeled) Transfers: Sit to/from Stand Sit to Stand: Min assist;Min guard         General transfer comment: from EOB, 3n1, recliner; min A at times to steady balance; cues for technique with rw including hand placement    Balance Overall balance assessment: History of Falls;Needs assistance Sitting-balance support: No upper extremity  supported Sitting balance-Leahy Scale: Fair     Standing balance support: Bilateral upper extremity supported;During functional activity Standing balance-Leahy Scale: Poor Standing balance comment: external support for balance                            ADL Overall ADL's : Needs assistance/impaired Eating/Feeding: Set up;Sitting   Grooming: Wash/dry face;Wash/dry hands;Set up;Moderate assistance Grooming Details (indicate cue type and reason): spouse completed shaving; pt washed face and hands with setup Upper Body Bathing: Minimal assitance;Sitting Upper Body Bathing Details (indicate cue type and reason): cues for safety with leads; therapist washed pt's back Lower Body Bathing: Minimal assistance;Sit to/from stand Lower Body Bathing Details (indicate cue type and reason): assist for balance with pt completing LB bathing including feet in sit<>stand Upper Body Dressing : Maximal assistance;Sitting   Lower Body Dressing: Maximal assistance;Sit to/from stand   Toilet Transfer: Minimal assistance;Ambulation;BSC;RW Toilet Transfer Details (indicate cue type and reason): min A to steady balance; cues for technique with rw Toileting- Clothing Manipulation and Hygiene: Sit to/from stand;Moderate assistance Toileting - Clothing Manipulation Details (indicate cue type and reason): assist for managing clothing and for balance Tub/ Shower Transfer: Moderate assistance;Ambulation;3 in 1;Tub transfer Tub/Shower Transfer Details (indicate cue type and reason): clinical judgement Functional mobility during ADLs: Min guard;Rolling walker General ADL Comments: Pt completed sponge bath at sink in sit<>stand as detailed above. Spouse present and involved in session.      Vision     Perception     Praxis      Pertinent Vitals/Pain Pain  Assessment: Faces Faces Pain Scale: Hurts little more Pain Location: "my knees" Pain Descriptors / Indicators: Sore Pain Intervention(s):  Monitored during session;Repositioned;Limited activity within patient's tolerance     Hand Dominance Right   Extremity/Trunk Assessment Upper Extremity Assessment Upper Extremity Assessment: Generalized weakness   Lower Extremity Assessment Lower Extremity Assessment: Defer to PT evaluation;Generalized weakness       Communication Communication Communication: HOH   Cognition Arousal/Alertness: Awake/alert (initially drowsy ) Behavior During Therapy: WFL for tasks assessed/performed Overall Cognitive Status: History of cognitive impairments - at baseline                 General Comments: Pt appears to be close to baseline with cognition, although more lethargic and decreased appetite than usual   General Comments       Exercises       Shoulder Instructions      Home Living Family/patient expects to be discharged to:: Private residence Living Arrangements: Spouse/significant other Available Help at Discharge: Family;Available 24 hours/day;Other (Comment) (Mon-Fri goes to day "enrichment pgm")) Type of Home: House Home Access: Ramped entrance     Home Layout: One level     Bathroom Shower/Tub: Tub/shower unit     Bathroom Accessibility: Yes   Home Equipment: Environmental consultantWalker - 2 wheels;Wheelchair - manual;Grab bars - tub/shower          Prior Functioning/Environment Level of Independence: Needs assistance  Gait / Transfers Assistance Needed: inside no device; assist always if goes out and uses cane vs RW ADL's / Homemaking Assistance Needed: assist with bathing, dressing, and toileting; per spouse she assist pt into shower and  he completes bathing on his on, spouse reports she dresses pt, he toilets with no physical assist        OT Diagnosis: Generalized weakness;Acute pain   OT Problem List: Decreased strength;Decreased activity tolerance;Impaired balance (sitting and/or standing);Decreased knowledge of use of DME or AE;Decreased safety awareness;Decreased  knowledge of precautions;Cardiopulmonary status limiting activity;Pain   OT Treatment/Interventions: Self-care/ADL training;Therapeutic exercise;Energy conservation;DME and/or AE instruction;Therapeutic activities;Patient/family education;Balance training    OT Goals(Current goals can be found in the care plan section) Acute Rehab OT Goals Patient Stated Goal: daughter wants to take pt home and improve his safety OT Goal Formulation: With patient/family Time For Goal Achievement: 04/11/16 Potential to Achieve Goals: Good ADL Goals Pt Will Perform Upper Body Bathing: with modified independence;sitting Pt Will Perform Lower Body Bathing: with modified independence;sit to/from stand Pt Will Transfer to Toilet: with modified independence (3n1 over toilet) Pt Will Perform Toileting - Clothing Manipulation and hygiene: with modified independence;sitting/lateral leans;sit to/from stand Pt Will Perform Tub/Shower Transfer: Tub transfer;with min guard assist;ambulating;rolling walker  OT Frequency: Min 2X/week   Barriers to D/C:            Co-evaluation              End of Session Equipment Utilized During Treatment: Gait belt;Rolling walker  Activity Tolerance: Patient tolerated treatment well;Patient limited by fatigue Patient left: in chair;with call bell/phone within reach;with chair alarm set;with family/visitor present   Time: 5409-81190913-0958 OT Time Calculation (min): 45 min Charges:  OT General Charges $OT Visit: 1 Procedure OT Evaluation $OT Eval Low Complexity: 1 Procedure OT Treatments $Self Care/Home Management : 8-22 mins G-Codes:    Pilar GrammesMathews, Tommy Goostree H 03/28/2016, 11:27 AM

## 2016-03-28 NOTE — Progress Notes (Signed)
[facility] PROGRESS NOTE  Jeremy Briggs:[id number] DOB: Oct 23, 1934 DOA: 03/26/2016  PCP: Johny Blamer, MD  Brief HPI: 82 year old Caucasian male with a past medical history of dementia of Lewy body, atrial flutter on anticoagulation, esophageal strictures, hypertension, who was seen recently on April 19 for food impaction. Patient underwent EGD. He was discharged home following this procedure. Patient presented with complaints of shortness of breath, fatigue, hiccups. He was found to have aspiration pneumonia and he was hospitalized for further management.  Past medical history:  Past Medical History  Diagnosis Date  . Esophageal stricture   . Hypertension   . Hyperlipemia   . GERD (gastroesophageal reflux disease)   . Dementia   . Arthritis     Consultants: Phone discussion with Dr. Matthias Hughs 4/22  Procedures:   EGD 4/19 done while he was in the ED  Antibiotics: Zosyn 4/21  Subjective: Patient feels well this morning. Hiccups have significantly improved. His wife is at the bedside. She complains that he has not been eating or drinking much. Patient denies any abdominal pain. No nausea or vomiting. Has not noticed any difficulty with swallowing pureed diet.  Objective:  Vital Signs  Filed Vitals:   03/27/16 0518 03/27/16 1327 03/27/16 2000 03/28/16 0534  BP: 138/68 116/59 117/58 140/56  Pulse: 61 67 72 69  Temp: 98.2 F (36.8 C) 98.4 F (36.9 C) 98.6 F (37 C) 97.6 F (36.4 C)  TempSrc: Oral Oral Oral Oral  Resp:  Height:  (1.702 m)     Weight: 81.602 kg (179 lb 14.4 oz)     SpO2: 98% 100% 94% 97%    Intake/Output Summary (Last 24 hours) at 03/28/16 1014 Last data filed at 03/28/16 0500  Gross per 24 hour  Intake    290 ml  Output    550 ml  Net   -260 ml   Filed Weights   03/27/16 0518  Weight: 81.602 kg (179 lb 14.4 oz)    General appearance: alert, distracted and no distress Resp: Improved air entry bilaterally. No  wheezing or rhonchi. Few crackles at the bases. Cardio: regular rate and rhythm, S1, S2 normal, no murmur, click, rub or gallop GI: soft, non-tender; bowel sounds normal; no masses,  no organomegaly Neurologic: Awake and alert. Distracted and slightly confused. No facial asymmetry. Moving all his extremities. No obvious deficits noted.  Lab Results:  Data Reviewed: I have personally reviewed following labs and imaging studies  CBC:  Recent Labs Lab 03/26/16 1800 03/27/16 0614 03/28/16 0330  WBC 16.5* 12.2* 9.3  NEUTROABS  --  8.7*  --   HGB 13.8 13.1 13.7  HCT 41.6 39.9 41.9  MCV 90.6 90.7 90.1  PLT 222 205 226   Basic Metabolic Panel:  Recent Labs Lab 03/26/16 1800 03/27/16 0614 03/28/16 0330  NA 137 138 140  K 3.8 3.5 3.5  CL 102 103 106  CO2 GLUCOSE 116* 105* 105*  BUN 23* 17 10  CREATININE 1.20 0.97 0.96  CALCIUM 9.6 8.8* 8.9   GFR: Estimated Creatinine Clearance: 63.8 mL/min (by C-G formula based on Cr of 0.96). Liver Function Tests:  Recent Labs Lab 03/26/16 1800 03/27/16 0614  AST 34 31  ALT 22 21  ALKPHOS 51 48  BILITOT 1.2 1.3*  PROT 7.4 6.4*  ALBUMIN 3.7 3.0*   Cardiac Enzymes:  Recent Labs Lab 03/26/16 0229  CKTOTAL 517*   Urine analysis:    Component Value  Date/Time   COLORURINE AMBER* 03/26/2016 2213   APPEARANCEUR CLOUDY* 03/26/2016 2213   LABSPEC 1.033* 03/26/2016 2213   PHURINE 5.5 03/26/2016 2213   GLUCOSEU NEGATIVE 03/26/2016 2213   HGBUR MODERATE* 03/26/2016 2213   BILIRUBINUR SMALL* 03/26/2016 2213   KETONESUR 15* 03/26/2016 2213   PROTEINUR 30* 03/26/2016 2213   UROBILINOGEN 1.0 08/13/2013 2024   NITRITE NEGATIVE 03/26/2016 2213   LEUKOCYTESUR NEGATIVE 03/26/2016 2213     Radiology Studies: Ct Head Wo Contrast  03/27/2016  CLINICAL DATA:  Fall.  Confusion. EXAM: CT HEAD WITHOUT CONTRAST TECHNIQUE: Contiguous axial images were obtained from the base of the skull through the vertex without intravenous  contrast. COMPARISON:  Head CT 11/06/2014 FINDINGS: Generalized atrophy and mild chronic small vessel ischemia, stable from prior exam.No intracranial hemorrhage, mass effect, or midline shift. No hydrocephalus. The basilar cisterns are patent. No evidence of territorial infarct. No intracranial fluid collection. Calvarium is intact. The mastoid air cells are well aerated. Chronic appearing right maxillary sinus opacity. IMPRESSION: Stable atrophy and chronic small vessel ischemia. No CT findings of acute intracranial abnormality. Electronically Signed   By: Rubye OaksMelanie  Ehinger M.D.   On: 03/27/2016 03:36   Ct Abdomen Pelvis W Contrast  03/26/2016  CLINICAL DATA:  Abdominal discomfort, recent upper endoscopy. EXAM: CT ABDOMEN AND PELVIS WITH CONTRAST TECHNIQUE: Multidetector CT imaging of the abdomen and pelvis was performed using the standard protocol following bolus administration of intravenous contrast. CONTRAST:  100mL ISOVUE-300 IOPAMIDOL (ISOVUE-300) INJECTION 61% COMPARISON:  None. FINDINGS: Lower chest: There is a branching patchy nodular pattern in the LEFT lower lobe suggesting pneumonia or aspiration pneumonitis. Hepatobiliary: No focal hepatic lesion. No biliary duct dilatation. Gallbladder is normal. Common bile duct is normal. Pancreas: Pancreas is normal. No ductal dilatation. No pancreatic inflammation. Spleen: Normal spleen Adrenals/urinary tract: Adrenal glands are normal. Several low-density cortical lesions in the kidneys are too small to characterize. No hydronephrosis. Ureters bladder normal. Stomach/Bowel: Small hiatal hernia. The stomach, duodenum, small-bowel appendix, cecum are normal. The colon is normal. There are diverticula sigmoid colon. Vascular/Lymphatic: Abdominal aorta is normal caliber with atherosclerotic calcification. There is no retroperitoneal or periportal lymphadenopathy. No pelvic lymphadenopathy. Calcified periportal lymph nodes. Reproductive: Prostate normal. Large fat  filled RIGHT inguinal hernia. Other: No free fluid. Musculoskeletal: No aggressive osseous lesion. IMPRESSION: 1. LEFT lower lobe PNEUMONIA VERSUS ASPIRATION PNEUMONITIS. 2. Dense calcification aorta. 3. Sigmoid diverticulosis. 4. Large fat filled RIGHT inguinal hernia. Electronically Signed   By: Genevive BiStewart  Edmunds M.D.   On: 03/26/2016 23:54   Dg Chest Port 1 View  03/26/2016  CLINICAL DATA:  Chest and abdominal discomfort following upper endoscopy. Abdominal tenderness. Atrial fibrillation hypertension. EXAM: PORTABLE CHEST 1 VIEW COMPARISON:  01/25/2016 FINDINGS: The heart size and mediastinal contours are within normal limits. Atherosclerotic calcification of aortic arch noted. Both lungs are clear. No evidence of pneumothorax or pleural effusion. IMPRESSION: No active disease. Electronically Signed   By: Myles RosenthalJohn  Stahl M.D.   On: 03/26/2016 22:01     Medications:  Scheduled: . apixaban  5 mg Oral BID  . baclofen  5 mg Oral BID  . citalopram  40 mg Oral q morning - 10a  . clonazePAM  1 mg Oral QHS  . donepezil  10 mg Oral QHS  . memantine  5 mg Oral Daily  . piperacillin-tazobactam (ZOSYN)  IV  3.375 g Intravenous Q8H  . sucralfate  1 g Oral TID WC & HS   Continuous:   PRN:  Assessment/Plan:  Active  Problems:   Atrial flutter   Hypertension   Food impaction of esophagus, S/P EGD 9/8   Current use of long term anticoagulation   Dementia with Lewy bodies   Aspiration pneumonia (HCC)   Prolonged QT interval   Aspiration pneumonia of left lower lobe (HCC)    Aspiration pneumonia This was in the setting of recent food impaction and hiccups. He is currently on Zosyn. WBC is Now normal. Aspiration precautions. Consult speech therapy for swallow evaluation.  Persistent Hiccups Started after EGD on 4/19. Seems to be better this morning. Due to prolonged QT interval noted on EKG, he could not be given Thorazine or Reglan. Seems to be improved after he was started on baclofen.  Recent  food impaction  Status post EGD 4/19. Esophagitis noted on that EGD. Continue Carafate. Discussed with Dr. Matthias Hughs yesterday. He recommends a barium swallow to rule out distal esophageal stricture. This will be ordered for tomorrow.  Essential home Hypertension Stable. Continue home medications.  History of atrial flutter Stable. Rate is well controlled. He is on Apixaban.  Prolonged QT interval Monitor on telemetry. Avoid qt prolonging medications.  Dementia Appears to be at baseline. Continue home medications.  DVT Prophylaxis: On Apixaban    Code Status: DNR  Family Communication: Discussed with patient and wife Disposition Plan: Patient is improving. Barium swallow tomorrow. And at that point in time, he can be transitioned to oral antibiotics and possibly even go home if the test results are unremarkable.    LOS: 1 day   [facility]  [facility] Pager [phone] 03/28/2016, 10:14 AM  If 7PM-7AM, please contact night-coverage at [email or web address], password [facility]

## 2016-03-29 ENCOUNTER — Inpatient Hospital Stay (HOSPITAL_COMMUNITY): Payer: Medicare Other

## 2016-03-29 DIAGNOSIS — R1310 Dysphagia, unspecified: Secondary | ICD-10-CM

## 2016-03-29 MED ORDER — BACLOFEN 10 MG PO TABS: 5.0000 mg | ORAL_TABLET | Freq: Three times a day (TID) | ORAL | Status: DC | PRN

## 2016-03-29 MED ORDER — RESOURCE THICKENUP CLEAR PO POWD
Freq: Once | ORAL | Status: AC
  Administered 2016-03-29: 16:00:00 via ORAL
  Filled 2016-03-29: qty 125

## 2016-03-29 MED ORDER — SODIUM CHLORIDE 0.9 % IV SOLN
3.0000 g | Freq: Three times a day (TID) | INTRAVENOUS | Status: DC
  Administered 2016-03-29: 3 g via INTRAVENOUS
  Filled 2016-03-29 (×3): qty 3

## 2016-03-29 MED ORDER — AMOXICILLIN-POT CLAVULANATE 400-57 MG/5ML PO SUSR
800.0000 mg | Freq: Two times a day (BID) | ORAL | Status: DC
  Administered 2016-03-29: 800 mg via ORAL
  Filled 2016-03-29: qty 10

## 2016-03-29 MED ORDER — AMOXICILLIN-POT CLAVULANATE 400-57 MG/5ML PO SUSR: 800.0000 mg | Freq: Two times a day (BID) | ORAL | Status: DC

## 2016-03-29 MED ORDER — SUCRALFATE 1 GM/10ML PO SUSP: 1.0000 g | Freq: Three times a day (TID) | ORAL | Status: DC

## 2016-03-29 MED ORDER — BACLOFEN 5 MG HALF TABLET: 5.0000 mg | ORAL_TABLET | Freq: Three times a day (TID) | ORAL | Status: DC | PRN

## 2016-03-29 NOTE — Discharge Instructions (Signed)
Aspiration Precautions °Aspiration is the breathing in (inhalation) of a liquid or object into the lungs. Things that can be inhaled into the lungs include:  °· Food. °· Any type of liquid, such as drinks or saliva. °· Stomach contents, such as vomit or stomach acid. °When these things go into the lungs, damage can occur and serious complications can result, such as: °· Lung infection (pneumonia). °· Collection of infected liquid (pus) in the lungs (lung abscess). °· Death. °CAUSES °The cause of aspiration may include:  °· A lowered level of awareness (consciousness) due to: °· Traumatic brain injury or head injury. °· Stroke. °· Diseases of the nerves, brain, or spinal cord. °· Seizures. °· A problem with the gag reflex. The gag reflex protects the body from swallowing things too quickly or things that are too large. °· Medical conditions that affect swallowing. °· Conditions that affect the food pipe (esophagus). °· Acid reflux. This is when stomach acid moves into the esophagus. °· Any type of surgery where a medicine to sleep (general anesthetic) or relax (sedative) is given. °· Alcohol abuse. °· Illegal drug abuse. °· Taking medicine that causes sleepiness, confusion, or weakness. °· Aging. °· Dental problems. °· Having a feeding tube. °SIGNS AND SYMPTOMS °Symptoms of aspiration may include:  °· Coughing after swallowing food or liquids. °· Difficulty breathing. This may include: °· Breathing quickly. °· Breathing very slowly. °· Loud breathing. °· Rumbling sounds from the lungs while breathing. °· Coughing up phlegm (sputum) that: °· Is yellow, tan, or green. °· Has pieces of food in it. °· Is bad smelling. °· A change in voice so that it sounds scratchy. °· A change in skin color. The skin may look red or blue.    °· Fever. °· Watery eyes. °· Pain in the chest or back. °· A pained look on the face.    °· A feeling of fullness in the throat or that something is stuck in the throat. °DIAGNOSIS °Aspiration may  be diagnosed by:  °· Chest X-ray. °· Bronchoscopy. This is a surgical procedure in which a thin, flexible tube with a camera is inserted into the nose or mouth to the lungs. The health care provider can then view the lungs. °· A swallowing evaluation study to find out: °· A person's risk of aspiration. °· How difficult it is for a person to swallow. °· What types of foods are safe for a person to eat. °PREVENTION °If you are caring for someone who can eat and drink through his or her mouth:  °· Have the person sit in an upright position when eating food or drinking fluids, such as: °· Sitting up in a chair. °· If sitting in a chair is not possible, position the person in bed so he or she is upright. °· Remind the person to eat slowly and chew well. °· Do not distract the person. This is especially important for people with thinking or memory (cognitive) problems. °· Check the person's mouth for leftover food after eating. °· Keep the person sitting upright for 30-45 minutes after eating. °· Do not serve food or drink for at least 2 hours before bedtime. °If you are caring for someone with a feeding tube who cannot eat or drink through his or her mouth: °· Keep the person in an upright position as much as possible. °· Do not  lay the person flat if he or she is getting continuous feedings. Turn the feeding pump off if you need to lay the person flat   for any reason. °· Check feeding tube residuals as directed by your health care provider. Ask your health care provider what residual amount is too high. °General guidelines to prevent aspiration in someone you are caring for include: °· Feed small amounts of food. Do not force feed. °· Food should be thickened as directed by the person's speech pathologist. °· Use as little water as possible when brushing the person's teeth or cleaning his or her mouth. °· Provide oral care before and after meals. °· Never put food or liquids in the mouth of a person who is not fully  alert. °· Crush pills and put them in soft food such as pudding or ice cream. Some pills should not be crushed. Check with your health care provider before crushing any medicine. °SEEK MEDICAL CARE IF: °· The person has a feeding tube and the feeding tube residual amount is too high. °· The person has a fever. °· The person tries to avoid food, such as refusing to eat or be fed, or is eating less than normal. °SEEK IMMEDIATE MEDICAL CARE IF:  °· The person has trouble breathing or starts to breathe quickly. °· The person is breathing very slowly or stops breathing. °· The person coughs a lot after eating or drinking. °· The person has a long-lasting (chronic) cough. °· The person coughs up thick, yellow, or tan sputum. °MAKE SURE YOU:  °· Understand these instructions. °· Will watch the person's condition. °· Will get help right away if the person is not doing well or gets worse. °  °This information is not intended to replace advice given to you by your health care provider. Make sure you discuss any questions you have with your health care provider. °  °Document Released: 12/25/2010 Document Revised: 12/13/2014 Document Reviewed: 02/27/2014 °Elsevier Interactive Patient Education ©2016 [employer] °Aspiration Pneumonia °Aspiration pneumonia is an infection in your lungs. It occurs when food, liquid, or stomach contents (vomit) are inhaled (aspirated) into your lungs. When these things get into your lungs, swelling (inflammation) and infection can occur. This can make it difficult for you to breathe. Aspiration pneumonia is a serious condition and can be life threatening. °RISK FACTORS °Aspiration pneumonia is more likely to occur when a person's cough (gag) reflex or ability to swallow has been decreased. Some things that can do this include:  °· Having a brain injury or disease, such as stroke, seizures, Parkinson's disease, dementia, or amyotrophic lateral sclerosis (ALS).   °· Being given general anesthetic  for procedures.   °· Being in a coma (unconscious).   °· Having a narrowing of the tube that carries food to the stomach (esophagus).   °· Drinking too much alcohol. If a person passes out and vomits, vomit can be swallowed into the lungs.   °· Taking certain medicines, such as tranquilizers or sedatives.   °SIGNS AND SYMPTOMS  °· Coughing after swallowing food or liquids.   °· Breathing problems, such as wheezing or shortness of breath.   °· Bluish skin. This can be caused by lack of oxygen.   °· Coughing up food or mucus. The mucus might contain blood, greenish material, or yellowish-white fluid (pus).   °· Fever.   °· Chest pain.   °· Being more tired than usual (fatigue).   °· Sweating more than usual.   °· Bad breath.   °DIAGNOSIS  °A physical exam will be done. During the exam, the health care provider will listen to your lungs with a stethoscope to check for:  °· Crackling sounds in the lungs. °· Decreased breath sounds. °·   A rapid heartbeat. °Various tests may be ordered. These may include:  °· Chest X-ray.   °· CT scan.   °· Swallowing study. This test looks at how food is swallowed and whether it goes into your breathing tube (trachea) or food pipe (esophagus).   °· Sputum culture. Saliva and mucus (sputum) are collected from the lungs or the tubes that carry air to the lungs (bronchi). The sputum is then tested for bacteria.   °· Bronchoscopy. This test uses a flexible tube (bronchoscope) to see inside the lungs. °TREATMENT  °Treatment will usually include antibiotic medicines. Other medicines may also be used to reduce fever or pain. You may need to be treated in the hospital. In the hospital, your breathing will be carefully monitored. Depending on how well you are breathing, you may need to be given oxygen, or you may need breathing support from a breathing machine (ventilator). For people who fail a swallowing study, a feeding tube might be placed in the stomach, or they may be asked to avoid certain  food textures or liquids when they eat. °HOME CARE INSTRUCTIONS  °· Carefully follow any special eating instructions you were given, such as avoiding certain food textures or thickening liquids. This reduces the risk of developing aspiration pneumonia again.  °· Only take over-the-counter or prescription medicines as directed by your health care provider. Follow the directions carefully.   °· If you were prescribed antibiotics, take them as directed. Finish them even if you start to feel better.   °· Rest as instructed by your health care provider.   °· Keep all follow-up appointments with your health care provider.   °SEEK MEDICAL CARE IF:  °· You develop worsening shortness of breath, wheezing, or difficulty breathing.   °· You develop a fever.   °· You have chest pain.   °MAKE SURE YOU:  °· Understand these instructions. °· Will watch your condition. °· Will get help right away if you are not doing well or get worse. °  °This information is not intended to replace advice given to you by your health care provider. Make sure you discuss any questions you have with your health care provider. °  °Document Released: 09/19/2009 Document Revised: 11/27/2013 Document Reviewed: 05/10/2013 °Elsevier Interactive Patient Education ©2016 [employer] ° °

## 2016-03-29 NOTE — Progress Notes (Signed)
D/C teaching completed including AVS printout, medications, prescriptions, aspiration precautions, appointments scheduled - all questions answered.

## 2016-03-29 NOTE — Progress Notes (Addendum)
Problems with Aspiration  Patient spends _____75_______ % of the time in bed. ____Aspiration__________________________________ causes frequent                          (% of time in bed)                                           (Problems with aspiration / dysphagia)  ______gagging and choking___________. Torso required to be elevated at least ___30-45 degrees__________ to prevent aspiration. Bed wedges do not provide  (gagging / choking)                                  (30 or more)  adequate elevation to resolve issues with aspiration. ___Gagging and choking________________ requires frequent and immediate changes in body                                                                                                    (Gagging / choking)  position which cannot be achieved with a normal bed.  Isidoro DonningAlesia Adelyna Brockman RN CCM Case Mgmt phone [phone]

## 2016-03-29 NOTE — Discharge Summary (Signed)
[facility]  Physician Discharge Summary   Patient ID: Jeremy Briggs MRN: [id number] DOB/AGE: [birth year] 82 y.o.  Admit date: 03/26/2016 Discharge date: 03/29/2016  PCP: Johny Blamer, MD  DISCHARGE DIAGNOSES:  Active Problems:   Atrial flutter   Hypertension   Food impaction of esophagus, S/P EGD 9/8   Current use of long term anticoagulation   Dementia with Lewy bodies   Aspiration pneumonia (HCC)   Prolonged QT interval   Aspiration pneumonia of left lower lobe (HCC)   RECOMMENDATIONS FOR OUTPATIENT FOLLOW UP: 1. Follow-up with Dr. Bosie Clos as scheduled 2. Home health has been ordered   DISCHARGE CONDITION: fair  Diet recommendation: Dysphagia 1 diet with nectar thick liquids  Filed Weights   03/27/16 0518  Weight: 81.602 kg (179 lb 14.4 oz)    INITIAL HISTORY: 82 year old Caucasian male with a past medical history of dementia of Lewy body, atrial flutter on anticoagulation, esophageal strictures, hypertension, who was seen recently on April 19 for food impaction. Patient underwent EGD. He was discharged home following this procedure. Patient presented with complaints of shortness of breath, fatigue, hiccups. He was found to have aspiration pneumonia and he was hospitalized for further management.  Consultations:  Gastroenterology  Procedures: EGD 4/19 done while he was in the ED   HOSPITAL COURSE:   Aspiration pneumonia This was in the setting of recent food impaction and hiccups. Asian was initially placed on Zosyn. WBC was elevated but started improving. Aspiration precautions were utilized. Patient was changed over to Unasyn and subsequently Augmentin.   Persistent Hiccups Started after EGD on 4/19. Patient was started on baclofen with which he has shown improvement. Due to prolonged QT interval noted on EKG, he could not be given Thorazine or Reglan.   Possible distal esophageal stricture/Recent food impaction Patient is status post EGD  4/19. This was done for a food impaction. This was done in the emergency department, the patient was discharged home subsequently. Esophagitis noted on that EGD. Patient placed on Carafate. Discussed with Dr. Matthias Hughs 4/22. He recommended a barium swallow to rule out distal esophageal stricture. The study was done this morning. The barium tablet appears to be lodged at the distal esophagus. Dr. Dulce Sellar was consulted. He evaluated the patient and discussed with the patient's wife. At this point in time, recommendation is to continue with pureed Diet and antireflux treatment with PPI and Carafate. Aspiration precautions. Patient has a follow-up appointment with Dr. Bosie Clos on May 1. And at that point in time further discussions will be held. Patient may benefit from having a hospital bed at home due to need to be upright to prevent aspiration. This will be ordered.   Essential home Hypertension Continue home medications.  History of atrial flutter Stable. Rate is well controlled. He is on Apixaban.  Dementia Appears to be at baseline. Continue home medications.  Overall improved. Okay for discharge home today. Discussed with his wife. Home health will be ordered.   PERTINENT LABS:  The results of significant diagnostics from this hospitalization (including imaging, microbiology, ancillary and laboratory) are listed below for reference.      Labs: Basic Metabolic Panel:  Recent Labs Lab 03/26/16 1800 03/27/16 0614 03/28/16 0330  NA 137 138 140  K 3.8 3.5 3.5  CL 102 103 106  CO2 23 23 23   GLUCOSE 116* 105* 105*  BUN 23* 17 10  CREATININE 1.20 0.97 0.96  CALCIUM 9.6 8.8* 8.9   Liver Function Tests:  Recent Labs Lab 03/26/16 1800  03/27/16 0614  AST 34 31  ALT 22 21  ALKPHOS 51 48  BILITOT 1.2 1.3*  PROT 7.4 6.4*  ALBUMIN 3.7 3.0*   CBC:  Recent Labs Lab 03/26/16 1800 03/27/16 0614 03/28/16 0330  WBC 16.5* 12.2* 9.3  NEUTROABS  --  8.7*  --   HGB 13.8 13.1 13.7    HCT 41.6 39.9 41.9  MCV 90.6 90.7 90.1  PLT 222 205 226   Cardiac Enzymes:  Recent Labs Lab 03/26/16 0229  CKTOTAL 517*    IMAGING STUDIES Ct Head Wo Contrast  03/27/2016  CLINICAL DATA:  Fall.  Confusion. EXAM: CT HEAD WITHOUT CONTRAST TECHNIQUE: Contiguous axial images were obtained from the base of the skull through the vertex without intravenous contrast. COMPARISON:  Head CT 11/06/2014 FINDINGS: Generalized atrophy and mild chronic small vessel ischemia, stable from prior exam.No intracranial hemorrhage, mass effect, or midline shift. No hydrocephalus. The basilar cisterns are patent. No evidence of territorial infarct. No intracranial fluid collection. Calvarium is intact. The mastoid air cells are well aerated. Chronic appearing right maxillary sinus opacity. IMPRESSION: Stable atrophy and chronic small vessel ischemia. No CT findings of acute intracranial abnormality. Electronically Signed   By: Rubye OaksMelanie  Ehinger M.D.   On: 03/27/2016 03:36   Ct Abdomen Pelvis W Contrast  03/26/2016  CLINICAL DATA:  Abdominal discomfort, recent upper endoscopy. EXAM: CT ABDOMEN AND PELVIS WITH CONTRAST TECHNIQUE: Multidetector CT imaging of the abdomen and pelvis was performed using the standard protocol following bolus administration of intravenous contrast. CONTRAST:  100mL ISOVUE-300 IOPAMIDOL (ISOVUE-300) INJECTION 61% COMPARISON:  None. FINDINGS: Lower chest: There is a branching patchy nodular pattern in the LEFT lower lobe suggesting pneumonia or aspiration pneumonitis. Hepatobiliary: No focal hepatic lesion. No biliary duct dilatation. Gallbladder is normal. Common bile duct is normal. Pancreas: Pancreas is normal. No ductal dilatation. No pancreatic inflammation. Spleen: Normal spleen Adrenals/urinary tract: Adrenal glands are normal. Several low-density cortical lesions in the kidneys are too small to characterize. No hydronephrosis. Ureters bladder normal. Stomach/Bowel: Small hiatal hernia. The  stomach, duodenum, small-bowel appendix, cecum are normal. The colon is normal. There are diverticula sigmoid colon. Vascular/Lymphatic: Abdominal aorta is normal caliber with atherosclerotic calcification. There is no retroperitoneal or periportal lymphadenopathy. No pelvic lymphadenopathy. Calcified periportal lymph nodes. Reproductive: Prostate normal. Large fat filled RIGHT inguinal hernia. Other: No free fluid. Musculoskeletal: No aggressive osseous lesion. IMPRESSION: 1. LEFT lower lobe PNEUMONIA VERSUS ASPIRATION PNEUMONITIS. 2. Dense calcification aorta. 3. Sigmoid diverticulosis. 4. Large fat filled RIGHT inguinal hernia. Electronically Signed   By: Genevive BiStewart  Edmunds M.D.   On: 03/26/2016 23:54   Dg Esophagus  03/29/2016  CLINICAL DATA:  Painful swallowing, food sticking in the esophagus. EXAM: ESOPHOGRAM/BARIUM SWALLOW TECHNIQUE: Single contrast examination was performed using  thin barium. FLUOROSCOPY TIME:  Radiation Exposure Index (as provided by the fluoroscopic device): If the device does not provide the exposure index: Fluoroscopy Time:  1 minutes 12 seconds. Number of Acquired Images:  None. COMPARISON:  None. FINDINGS: A limited examination was performed in the LPO position, due to patient condition. There is focal narrowing of the distal esophagus, above a small hiatal hernia, through which a 13 mm barium tablet would not pass. IMPRESSION: 1. Distal esophageal narrowing, through which a 13 mm barium tablet would not pass. 2. Small hiatal hernia. Electronically Signed   By: Leanna BattlesMelinda  Blietz M.D.   On: 03/29/2016 08:36   Dg Chest Port 1 View  03/26/2016  CLINICAL DATA:  Chest and abdominal discomfort following  upper endoscopy. Abdominal tenderness. Atrial fibrillation hypertension. EXAM: PORTABLE CHEST 1 VIEW COMPARISON:  01/25/2016 FINDINGS: The heart size and mediastinal contours are within normal limits. Atherosclerotic calcification of aortic arch noted. Both lungs are clear. No evidence of  pneumothorax or pleural effusion. IMPRESSION: No active disease. Electronically Signed   By: Myles Rosenthal M.D.   On: 03/26/2016 22:01    DISPOSITION: Home with wife  Discharge Instructions    Call MD for:  difficulty breathing, headache or visual disturbances    Complete by:  As directed      Call MD for:  extreme fatigue    Complete by:  As directed      Call MD for:  persistant dizziness or light-headedness    Complete by:  As directed      Call MD for:  persistant nausea and vomiting    Complete by:  As directed      Call MD for:  severe uncontrolled pain    Complete by:  As directed      Call MD for:  temperature >100.4    Complete by:  As directed      Discharge instructions    Complete by:  As directed   Please follow up with Dr. Bosie Clos on May 5 as scheduled. Please use aspiration precautions as discussed by speech therapy. Please eat pureed Diet with nectar thick liquids.  You were cared for by a hospitalist during your hospital stay. If you have any questions about your discharge medications or the care you received while you were in the hospital after you are discharged, you can call the unit and asked to speak with the hospitalist on call if the hospitalist that took care of you is not available. Once you are discharged, your primary care physician will handle any further medical issues. Please note that NO REFILLS for any discharge medications will be authorized once you are discharged, as it is imperative that you return to your primary care physician (or establish a relationship with a primary care physician if you do not have one) for your aftercare needs so that they can reassess your need for medications and monitor your lab values. If you do not have a primary care physician, you can call [phone] for a physician referral.     Increase activity slowly    Complete by:  As directed            ALLERGIES:  Allergies  Allergen Reactions  . Lipitor [Atorvastatin] Other  (See Comments)    Goes crazy  . Statins Other (See Comments)    Goes crazy  . Zetia [Ezetimibe] Other (See Comments)    Angry, violent mannerisms  . Ambien [Zolpidem Tartrate] Other (See Comments)    unknown     Current Discharge Medication List    START taking these medications   Details  amoxicillin-clavulanate (AUGMENTIN) 400-57 MG/5ML suspension Take 10 mLs (800 mg total) by mouth every 12 (twelve) hours. For 5 more days Qty: 100 mL, Refills: 0    baclofen (LIORESAL) 10 MG tablet Take 0.5 tablets (5 mg total) by mouth 3 (three) times daily as needed (hiccoughs). Qty: 30 tablet, Refills: 0    sucralfate (CARAFATE) 1 GM/10ML suspension Take 10 mLs (1 g total) by mouth 4 (four) times daily -  with meals and at bedtime. Qty: 420 mL, Refills: 0      CONTINUE these medications which have NOT CHANGED   Details  apixaban (ELIQUIS) 5 MG TABS tablet Take  1 tablet (5 mg total) by mouth 2 (two) times daily. Qty: 60 tablet, Refills: 0    citalopram (CELEXA) 40 MG tablet Take 40 mg by mouth every morning.     clonazePAM (KLONOPIN) 0.5 MG tablet Take 1 tablet (0.5 mg total) by mouth at bedtime. Qty: 30 tablet, Refills: 5    donepezil (ARICEPT) 10 MG tablet Take 1 tablet (10 mg total) by mouth at bedtime. Qty: 30 tablet, Refills: 12    KRILL OIL PO Take 1 tablet by mouth daily.    memantine (NAMENDA) 5 MG tablet Take 5 mg by mouth every morning.    omeprazole (PRILOSEC) 20 MG capsule Take 2 capsules (40 mg total) by mouth 2 (two) times daily before a meal. Qty: 60 capsule, Refills: 0      STOP taking these medications     Red Yeast Rice Extract (RED YEAST RICE PO)      oxyCODONE-acetaminophen (PERCOCET/ROXICET) 5-325 MG per tablet        Follow-up Information    Call Johny Blamer, MD.   Specialty:  Family Medicine   Why:  As needed   Contact information:   [address] [place] [address] [phone]       Follow up with Shirley Friar.,  MD.   Specialty:  Gastroenterology   Why:  see on May 5 as previously scheduled.   Contact information:   [address] [place] [address] [phone]       TOTAL DISCHARGE TIME: 35 minutes  [facility]  [facility] Pager [phone]  03/29/2016, 2:59 PM

## 2016-03-29 NOTE — Consult Note (Signed)
[facility]ology Consultation Note  Referring Provider: Dr. Osvaldo Shipper [facility]) Primary Care Physician:  Johny Blamer, MD Primary Gastroenterologist:  Dr. Charlott Rakes  Reason for Consultation:  dysphagia  HPI: Jeremy Briggs is a 82 y.o. male whom we've been asked to see for dysphagia with distal esophageal stricture noted on recent endoscopy.  Patient is demented, and most of history is from his wife.  Apparently he had food impaction last week, with endoscopy and food removal by Dr. Matthias Hughs; this endoscopy showed distal esophagitis but not obvious distal esophageal stricture.  He was discharged home pre-procedure, but returned for worsening weakness and shortness of breath.  He was found to have aspiration pneumonia, for which he has been treated.  Respiratory status is improving.  Barium swallow, to better assess for distal esophageal stricture, showed findings consistent with distal esophageal stricture.  Patient has tolerated soft pureed food during this admission, without evidence of vomiting or regurgitation or sialorrhea.  No abdominal pain.  No blood in stool.  Wife relays how much significantly better her husband looks now, in comparison with his pre-admission status.   Past Medical History  Diagnosis Date  . Esophageal stricture   . Hypertension   . Hyperlipemia   . GERD (gastroesophageal reflux disease)   . Dementia   . Arthritis     Past Surgical History  Procedure Laterality Date  . Esophageal stret    . Esophagogastroduodenoscopy  01/27/2012    Procedure: ESOPHAGOGASTRODUODENOSCOPY (EGD);  Surgeon: Petra Kuba, MD;  Location: [facility] ENDOSCOPY;  Service: Endoscopy;  Laterality: N/A;  . Esophagogastroduodenoscopy N/A 08/13/2013    Procedure: ESOPHAGOGASTRODUODENOSCOPY (EGD);  Surgeon: Vertell Novak., MD;  Location: [facility] ENDOSCOPY;  Service: Endoscopy;  Laterality: N/A;  . Foreign body removal N/A 08/13/2013    Procedure: FOREIGN BODY REMOVAL;  Surgeon: Vertell Novak., MD;  Location: [facility] ENDOSCOPY;  Service: Endoscopy;  Laterality: N/A;  . Cardioversion N/A 09/13/2013    Procedure: CARDIOVERSION;  Surgeon: Othella Boyer, MD;  Location: [facility] ENDOSCOPY;  Service: Cardiovascular;  Laterality: N/A;  . Esophagogastroduodenoscopy N/A 03/24/2016    Procedure: ESOPHAGOGASTRODUODENOSCOPY (EGD);  Surgeon: Bernette Redbird, MD;  Location: [facility] ENDOSCOPY;  Service: Endoscopy;  Laterality: N/A;  . Esophagogastroduodenoscopy (egd) with propofol  03/24/2016    Procedure: ESOPHAGOGASTRODUODENOSCOPY (EGD) WITH PROPOFOL;  Surgeon: Bernette Redbird, MD;  Location: [facility] ENDOSCOPY;  Service: Endoscopy;;    Prior to Admission medications   Medication Sig Start Date End Date Taking? Authorizing Provider  apixaban (ELIQUIS) 5 MG TABS tablet Take 1 tablet (5 mg total) by mouth 2 (two) times daily. 08/14/13  Yes Elease Etienne, MD  citalopram (CELEXA) 40 MG tablet Take 40 mg by mouth every morning.    Yes Historical Provider, MD  clonazePAM (KLONOPIN) 0.5 MG tablet Take 1 tablet (0.5 mg total) by mouth at bedtime. Patient taking differently: Take 1 mg by mouth at bedtime.  03/26/14  Yes Suanne Marker, MD  donepezil (ARICEPT) 10 MG tablet Take 1 tablet (10 mg total) by mouth at bedtime. 03/26/14  Yes Vikram R Penumalli, MD  KRILL OIL PO Take 1 tablet by mouth daily.   Yes Historical Provider, MD  memantine (NAMENDA) 5 MG tablet Take 5 mg by mouth every morning.   Yes Historical Provider, MD  omeprazole (PRILOSEC) 20 MG capsule Take 2 capsules (40 mg total) by mouth 2 (two) times daily before a meal. 03/24/16  Yes Bernette Redbird, MD  Red Yeast Rice Extract (RED YEAST RICE  PO) Take 2 capsules by mouth 2 (two) times daily.    Yes Historical Provider, MD  oxyCODONE-acetaminophen (PERCOCET/ROXICET) 5-325 MG per tablet Take 1 tablet by mouth every 6 (six) hours as needed for moderate pain or severe pain. Patient not taking: Reported on 01/25/2016 11/06/14   Bethann Berkshire, MD    Current  Facility-Administered Medications  Medication Dose Route Frequency Provider Last Rate Last Dose  . Ampicillin-Sulbactam (UNASYN) 3 g in sodium chloride 0.9 % 100 mL IVPB  3 g Intravenous Q8H Marquita Palms, RPH      . baclofen (LIORESAL) tablet 5 mg  5 mg Oral BID Osvaldo Shipper, MD   5 mg at 03/28/16 2123  . citalopram (CELEXA) tablet 20 mg  20 mg Oral q morning - 10a Osvaldo Shipper, MD      . clonazePAM Scarlette Calico) tablet 1 mg  1 mg Oral QHS Therisa Doyne, MD   1 mg at 03/28/16 2123  . donepezil (ARICEPT) tablet 10 mg  10 mg Oral QHS Therisa Doyne, MD   10 mg at 03/28/16 2123  . memantine (NAMENDA) tablet 5 mg  5 mg Oral Daily Therisa Doyne, MD   5 mg at 03/28/16 1056  . sucralfate (CARAFATE) 1 GM/10ML suspension 1 g  1 g Oral TID WC & HS Osvaldo Shipper, MD   1 g at 03/29/16 0622    Allergies as of 03/26/2016 - Review Complete 03/26/2016  Allergen Reaction Noted  . Lipitor [atorvastatin] Other (See Comments) 01/25/2016  . Statins Other (See Comments) 08/13/2013  . Zetia [ezetimibe] Other (See Comments) 03/24/2016  . Ambien [zolpidem tartrate] Other (See Comments) 01/25/2016    Family History  Problem Relation Age of Onset  . Alzheimer's disease Father   . Dementia Mother   . Cancer Daughter     Social History   Social History  . Marital Status: Married    Spouse Name: Jeremy Briggs  . Number of Children: 2  . Years of Education: college   Occupational History  . Retired    Social History Main Topics  . Smoking status: Former Smoker -- 3.00 packs/day for 50 years    Types: Cigarettes    Quit date: 12/14/1998  . Smokeless tobacco: Never Used  . Alcohol Use: No     Comment: daily  . Drug Use: No  . Sexual Activity: Not on file   Other Topics Concern  . Not on file   Social History Narrative   Patient lives at home with spouse.   Caffeine Use: 4-5 cups daily    Review of Systems: Unable to obtain due to patient's demented state  Physical Exam: Vital signs  in last 24 hours: Temp:  [98.2 F (36.8 C)-99 F (37.2 C)] 98.3 F (36.8 C) (04/24 0343) Pulse Rate:  [60-69] 69 (04/24 0343) Resp:  [16-18] 18 (04/24 0343) BP: (109-141)/(55-78) 141/78 mmHg (04/24 0343) SpO2:  [95 %-98 %] 96 % (04/24 0343) Last BM Date: 03/25/16 General:   Alert,  Elderly, somewhat frail-appearing, but is in NAD Head:  Normocephalic and atraumatic. Eyes:  Sclera clear, no icterus.   Conjunctiva pink. Ears:  Normal auditory acuity. Nose:  No deformity, discharge,  or lesions. Mouth:  No deformity or lesions.  Oropharynx pink & moist. Neck:  Supple; no masses or thyromegaly. Lungs:  Clear throughout to auscultation, except for faint R > L base rales; no acute respiratory distress Heart:  Regular rate and rhythm; no murmurs, clicks, rubs,  or gallops. Abdomen:  Soft, nontender  and nondistended. No masses, hepatosplenomegaly or hernias noted. Normal bowel sounds, without guarding, and without rebound.     Msk:  Some extremity atrophy, Symmetrical, without gross deformities. Normal posture. Pulses:  Normal pulses noted. Extremities:  Without clubbing or edema. Neurologic:  Chronic dementia;  No focal neurologic deficits Skin:  Scattered ecchymoses, otherwise Intact without significant lesions or rashes; chronic pallor per wife Psych:  Alert and cooperative. Normal mood and affect.   Lab Results:  Recent Labs  03/26/16 1800 03/27/16 0614 03/28/16 0330  WBC 16.5* 12.2* 9.3  HGB 13.8 13.1 13.7  HCT 41.6 39.9 41.9  PLT 222 205 226   BMET  Recent Labs  03/26/16 1800 03/27/16 0614 03/28/16 0330  NA 137 138 140  K 3.8 3.5 3.5  CL 102 103 106  CO2 23 23 23   GLUCOSE 116* 105* 105*  BUN 23* 17 10  CREATININE 1.20 0.97 0.96  CALCIUM 9.6 8.8* 8.9   LFT  Recent Labs  03/27/16 0614  PROT 6.4*  ALBUMIN 3.0*  AST 31  ALT 21  ALKPHOS 48  BILITOT 1.3*   PT/INR No results for input(s): LABPROT, INR in the last 72 hours.  Studies/Results: Dg  Esophagus  03/29/2016  CLINICAL DATA:  Painful swallowing, food sticking in the esophagus. EXAM: ESOPHOGRAM/BARIUM SWALLOW TECHNIQUE: Single contrast examination was performed using  thin barium. FLUOROSCOPY TIME:  Radiation Exposure Index (as provided by the fluoroscopic device): If the device does not provide the exposure index: Fluoroscopy Time:  1 minutes 12 seconds. Number of Acquired Images:  None. COMPARISON:  None. FINDINGS: A limited examination was performed in the LPO position, due to patient condition. There is focal narrowing of the distal esophagus, above a small hiatal hernia, through which a 13 mm barium tablet would not pass. IMPRESSION: 1. Distal esophageal narrowing, through which a 13 mm barium tablet would not pass. 2. Small hiatal hernia. Electronically Signed   By: Leanna BattlesMelinda  Blietz M.D.   On: 03/29/2016 08:36   Impression:  1.  Dysphagia.  Likely multifactorial (presbyesophagus, esoophagitis, distal esophageal stricture, small hiatal hernia). 2.  Abnormal barium esophagram (distal esophageal narrowing). 3.  Aspiration pneumonia.  Plan:  1.  Patient should have intensive antireflux measures (head-of-bed elevated to at least 30 degrees, small and frequent meals). 2.  PPI (suspension po bid), now and upon discharge. 3.  Sucralfate suspension QAC/QHS, now and upon discharge. 4.  I discussed with patient's wife at length; he is able to tolerate and swallow pureed foods, without any vomiting or regurgitation.  As a result, while he certainly has narrowing of the distal esophagus (which was observed on endoscopy last week), there is no evidence of complete occlusion (in fact the 10-mm endoscope less than one week ago was able to traverse the distal esophagus into the stomach without difficulty).   5.  Would hold off on endoscopy right now, in absence of recurrent food impaction, symptoms of which patient does not currently have. 6.  Pureed diet ONLY for now; do NOT advance. 7.   Discharge planning per primary team, but there are no plans for further GI tract evaluation during this admission. 8.  Patient has outpatient follow-up already arranged with Dr. Bosie ClosSchooler for May 5th, at which time Dr. Bosie ClosSchooler will reassess patient's symptoms and decide optimal timing for repeat endoscopy. 9.  [facility] will sign-off; please call with questions; thank you for the consultation.   LOS: 2 days   Jericca Russett M  03/29/2016, 11:08 AM  Pager  [phone] If no answer or after 5 PM call [phone]

## 2016-03-29 NOTE — Progress Notes (Signed)
Pharmacy Antibiotic Note  Jeremy Briggs is a 82 y.o. male admitted on 03/26/2016 with aspiration pneumonia.  Pharmacy has been consulted for Unasyn dosing.  Plan: Unasyn 3g IV q8h  Monitor culture data, renal function and clinical course F/u swallow eval  Height: 5\' 7"  (170.2 cm) Weight: 179 lb 14.4 oz (81.602 kg) IBW/kg (Calculated) : 66.1  Temp (24hrs), Avg:98.5 F (36.9 C), Min:98.2 F (36.8 C), Max:99 F (37.2 C)   Recent Labs Lab 03/26/16 1800 03/27/16 0614 03/28/16 0330  WBC 16.5* 12.2* 9.3  CREATININE 1.20 0.97 0.96    Estimated Creatinine Clearance: 63.8 mL/min (by C-G formula based on Cr of 0.96).    Allergies  Allergen Reactions  . Lipitor [Atorvastatin] Other (See Comments)    Goes crazy  . Statins Other (See Comments)    Goes crazy  . Zetia [Ezetimibe] Other (See Comments)    Angry, violent mannerisms  . Ambien [Zolpidem Tartrate] Other (See Comments)    unknown     Arlean Hoppingorey M. Newman PiesBall, PharmD, BCPS Clinical Pharmacist Pager [phone] 03/29/2016 9:55 AM

## 2016-03-29 NOTE — Care Management Note (Addendum)
Case Management Note  Patient Details  Name: Effie BerkshireRoger N Berres MRN: [id number] Date of Birth: [birth year]  Subjective/Objective:       Aspiration PNA             Action/Plan: Discharge Planning: AVS reviewed:   NCM spoke to pt and gave permission to speak to Rennis Hardingdtr, Pamela Cadieux-Lisk # [phone]. States pt needs 3n1 for home and they do want hospital bed. [facility] and [facility]. [facility] Contacted [facility] for DME, faxed orders. Waiting to hear back to see if they can deliver hospital bed tonight. Dtr states he really needs 3n1 bedside commode tonight. [facility] and [facility]   Expected Discharge Date:  03/29/2016              Expected Discharge Plan:  Home w Home Health Services  In-House Referral:  NA  Discharge planning Services  CM Consult  Post Acute Care Choice:  Home Health Choice offered to:  daughter  DME Arranged:  [facility] DME Agency:  Hospital bed, 3n1   HH Arranged:  RN, PT, OT, aide [facility]  Status of Service:  complete  Medicare Important Message Given:  yes Date Medicare IM Given:    Medicare IM give by:    Date Additional Medicare IM Given:    Additional Medicare Important Message give by:     If discussed at Long Length of Stay Meetings, dates discussed:    Additional Comments:  Elliot CousinShavis, Robinson Brinkley Ellen, RN 03/29/2016, 6:24 PM

## 2016-03-29 NOTE — Progress Notes (Signed)
[facility] PROGRESS NOTE  Jeremy Briggs WJX:[id number] DOB: October 16, 1934 DOA: 03/26/2016  PCP: Johny Blamer, MD  Brief HPI: 82 year old Caucasian male with a past medical history of dementia of Lewy body, atrial flutter on anticoagulation, esophageal strictures, hypertension, who was seen recently on April 19 for food impaction. Patient underwent EGD. He was discharged home following this procedure. Patient presented with complaints of shortness of breath, fatigue, hiccups. He was found to have aspiration pneumonia and he was hospitalized for further management.  Past medical history:  Past Medical History  Diagnosis Date  . Esophageal stricture   . Hypertension   . Hyperlipemia   . GERD (gastroesophageal reflux disease)   . Dementia   . Arthritis     Consultants: Phone discussion with Dr. Matthias Hughs 4/22  Procedures:   EGD 4/19 done while he was in the ED  Antibiotics: Zosyn 4/21  Subjective: Patient denies any complaints this morning. Hiccups resolved. His wife is at the bedside. Patient denies any shortness of breath, chest pain, nausea, vomiting. Continues to have some difficulty with swallowing liquids, with which he coughs.   Objective:  Vital Signs  Filed Vitals:   03/28/16 0534 03/28/16 1328 03/28/16 1945 03/29/16 0343  BP: 140/56 109/55 135/64 141/78  Pulse: 69 60 60 69  Temp: 97.6 F (36.4 C) 98.2 F (36.8 C) 99 F (37.2 C) 98.3 F (36.8 C)  TempSrc: Oral Oral Oral Oral  Resp: Height:      Weight:      SpO2: 97% 98% 95% 96%    Intake/Output Summary (Last 24 hours) at 03/29/16 0950 Last data filed at 03/29/16 0855  Gross per 24 hour  Intake    240 ml  Output   1000 ml  Net   -760 ml   Filed Weights   03/27/16 0518  Weight: 81.602 kg (179 lb 14.4 oz)    General appearance: alert, distracted and no distress Resp: Good air entry bilaterally. No wheezing or rhonchi. Few crackles at the bases. Less than before Cardio: regular  rate and rhythm, S1, S2 normal, no murmur, click, rub or gallop GI: soft, non-tender; bowel sounds normal; no masses,  no organomegaly Neurologic: Awake and alert. Distracted and slightly confused. No facial asymmetry. Moving all his extremities. No obvious deficits noted.  Lab Results:  Data Reviewed: I have personally reviewed following labs and imaging studies  CBC:  Recent Labs Lab 03/26/16 1800 03/27/16 0614 03/28/16 0330  WBC 16.5* 12.2* 9.3  NEUTROABS  --  8.7*  --   HGB 13.8 13.1 13.7  HCT 41.6 39.9 41.9  MCV 90.6 90.7 90.1  PLT 222 205 226   Basic Metabolic Panel:  Recent Labs Lab 03/26/16 1800 03/27/16 0614 03/28/16 0330  NA 137 138 140  K 3.8 3.5 3.5  CL 102 103 106  CO2 GLUCOSE 116* 105* 105*  BUN 23* 17 10  CREATININE 1.20 0.97 0.96  CALCIUM 9.6 8.8* 8.9   GFR: Estimated Creatinine Clearance: 63.8 mL/min (by C-G formula based on Cr of 0.96). Liver Function Tests:  Recent Labs Lab 03/26/16 1800 03/27/16 0614  AST 34 31  ALT 22 21  ALKPHOS 51 48  BILITOT 1.2 1.3*  PROT 7.4 6.4*  ALBUMIN 3.7 3.0*   Cardiac Enzymes:  Recent Labs Lab 03/26/16 0229  CKTOTAL 517*   Urine analysis:    Component Value Date/Time   COLORURINE AMBER* 03/26/2016 2213   APPEARANCEUR CLOUDY* 03/26/2016 2213  LABSPEC 1.033* 03/26/2016 2213   PHURINE 5.5 03/26/2016 2213   GLUCOSEU NEGATIVE 03/26/2016 2213   HGBUR MODERATE* 03/26/2016 2213   BILIRUBINUR SMALL* 03/26/2016 2213   KETONESUR 15* 03/26/2016 2213   PROTEINUR 30* 03/26/2016 2213   UROBILINOGEN 1.0 08/13/2013 2024   NITRITE NEGATIVE 03/26/2016 2213   LEUKOCYTESUR NEGATIVE 03/26/2016 2213     Radiology Studies: Dg Esophagus  03/29/2016  CLINICAL DATA:  Painful swallowing, food sticking in the esophagus. EXAM: ESOPHOGRAM/BARIUM SWALLOW TECHNIQUE: Single contrast examination was performed using  thin barium. FLUOROSCOPY TIME:  Radiation Exposure Index (as provided by the fluoroscopic device):  If the device does not provide the exposure index: Fluoroscopy Time:  1 minutes 12 seconds. Number of Acquired Images:  None. COMPARISON:  None. FINDINGS: A limited examination was performed in the LPO position, due to patient condition. There is focal narrowing of the distal esophagus, above a small hiatal hernia, through which a 13 mm barium tablet would not pass. IMPRESSION: 1. Distal esophageal narrowing, through which a 13 mm barium tablet would not pass. 2. Small hiatal hernia. Electronically Signed   By: Leanna Battles M.D.   On: 03/29/2016 08:36     Medications:  Scheduled: . apixaban  5 mg Oral BID  . baclofen  5 mg Oral BID  . citalopram  20 mg Oral q morning - 10a  . clonazePAM  1 mg Oral QHS  . donepezil  10 mg Oral QHS  . memantine  5 mg Oral Daily  . piperacillin-tazobactam (ZOSYN)  IV  3.375 g Intravenous Q8H  . sucralfate  1 g Oral TID WC & HS   Continuous:   PRN:  Assessment/Plan:  Active Problems:   Atrial flutter   Hypertension   Food impaction of esophagus, S/P EGD 9/8   Current use of long term anticoagulation   Dementia with Lewy bodies   Aspiration pneumonia (HCC)   Prolonged QT interval   Aspiration pneumonia of left lower lobe (HCC)    Aspiration pneumonia This was in the setting of recent food impaction and hiccups. He is currently on Zosyn. WBC is now normal. Aspiration precautions. Consulted speech therapy for swallow evaluation, especially with symptoms of coughing with liquids. Leaving on IV antibiotics for now. Change to Unasyn.  Persistent Hiccups Started after EGD on 4/19. Seems to be better this morning. Due to prolonged QT interval noted on EKG, he could not be given Thorazine or Reglan. Seems to be improved after he was started on baclofen.  Possible distal esophageal stricture/Recent food impaction Patient is status post EGD 4/19. This was done for a food impaction. This was done in the emergency department, the patient was discharged home  subsequently. Esophagitis noted on that EGD. Patient placed on Carafate. Discussed with Dr. Matthias Hughs 4/22. He recommended a barium swallow to rule out distal esophageal stricture. The study was done this morning. The barium tablet appears to be lodged at the distal esophagus. Will consult gastroenterology to formally evaluate him as he likely has a significant distal esophageal stricture. We will hold his Apixaban.  Essential home Hypertension Stable. Continue home medications.  History of atrial flutter Stable. Rate is well controlled. He is on Apixaban.  Prolonged QT interval Monitor on telemetry. Avoid qt prolonging medications.  Dementia Appears to be at baseline. Continue home medications.  DVT Prophylaxis: On Apixaban. This will be held for possible procedure.    Code Status: DNR  Family Communication: Discussed with patient and wife Disposition Plan: Patient is stable. Await  GI input.     LOS: 2 days   St Peters AscKRISHNAN,Jeremy Briggs  [facility] Pager [phone] 03/29/2016, 9:50 AM  If 7PM-7AM, please contact night-coverage at [email or web address], password [email or web address]

## 2016-03-29 NOTE — Evaluation (Addendum)
Clinical/Bedside Swallow Evaluation Patient Details  Name: Jeremy Briggs MRN: [id number] Date of Birth: 04-Mar-1934  Today's Date: 03/29/2016 Time: SLP Start Time (ACUTE ONLY): 1355 SLP Stop Time (ACUTE ONLY): 1447 SLP Time Calculation (min) (ACUTE ONLY): 52 min  Past Medical History:  Past Medical History  Diagnosis Date  . Esophageal stricture   . Hypertension   . Hyperlipemia   . GERD (gastroesophageal reflux disease)   . Dementia   . Arthritis    Past Surgical History:  Past Surgical History  Procedure Laterality Date  . Esophageal stret    . Esophagogastroduodenoscopy  01/27/2012    Procedure: ESOPHAGOGASTRODUODENOSCOPY (EGD);  Surgeon: Petra KubaMarc E Magod, MD;  [facility];  Service: Endoscopy;  Laterality: N/A;  . Esophagogastroduodenoscopy N/A 08/13/2013    Procedure: ESOPHAGOGASTRODUODENOSCOPY (EGD);  Surgeon: Vertell NovakJames L Edwards Jr., MD;  [facility];  Service: Endoscopy;  Laterality: N/A;  . Foreign body removal N/A 08/13/2013    Procedure: FOREIGN BODY REMOVAL;  Surgeon: Vertell NovakJames L Edwards Jr., MD;  Location: [facility] ENDOSCOPY;  Service: Endoscopy;  Laterality: N/A;  . Cardioversion N/A 09/13/2013    Procedure: CARDIOVERSION;  Surgeon: Othella BoyerWilliam S Tilley, MD;  [facility];  Service: Cardiovascular;  Laterality: N/A;  . Esophagogastroduodenoscopy N/A 03/24/2016    Procedure: ESOPHAGOGASTRODUODENOSCOPY (EGD);  Surgeon: Bernette Redbirdobert Buccini, MD;  [facility];  Service: Endoscopy;  Laterality: N/A;  . Esophagogastroduodenoscopy (egd) with propofol  03/24/2016    Procedure: ESOPHAGOGASTRODUODENOSCOPY (EGD) WITH PROPOFOL;  Surgeon: Bernette Redbirdobert Buccini, MD;  [facility];  Service: Endoscopy;;   HPI:  82 y.o. male with hx of Lewy body dementia and dysphagia with distal esophageal stricture noted on recent endoscopy.  Apparently he had food impaction last week, with endoscopy and food removal by Dr. Matthias HughsBuccini; this endoscopy showed distal esophagitis but not  obvious distal esophageal stricture. He was discharged home pre-procedure, but returned for worsening weakness and shortness of breath. He was found to have aspiration pneumonia, for which he has been treated. Respiratory status is improving. Barium swallow, to better assess for distal esophageal stricture, showed findings consistent with distal esophageal stricture. Pt is scheduled for f/u as OP with Dr. Bosie ClosSchooler on May 5.  Dr. Dulce Sellarutlaw recommends pureed diet; intensive antireflux measures.  Pt's wife reports 4/24 that pt tolerated POs better today; however he coughs frequently with liquids and has been doing so for several months.    Assessment / Plan / Recommendation Clinical Impression  Pt presents with both an esophageal and oropharyngeal dysphagia, the latter likely being related to his progressive dementia.  He demonstrates overt s/s of aspiration with consumption of thin liquids, leading to explosive and consistent coughing.  Nectar liquids do not elicit such signs.  Family present - we discussed esophageal precautions to facilitate transfer of POs and limit backflow.  We discussed additional precautions to minimize potential for aspiration.  Education completed re: necessity of pt feeding himself as long as possible, as well as dementia and its ultimate impact upon swallow function.  Recommend continuing pureed diet, thicken liquids to nectar.  Crush meds and give in puree or nectar.  Pt may be D/Cd home today.  Will order thickener from pharmacy.      Aspiration Risk  Moderate aspiration risk    Diet Recommendation     Medication Administration: Crushed with puree    Other  Recommendations Oral Care Recommendations: Oral care BID Other Recommendations: Order thickener from pharmacy   Follow up Recommendations   (tba)  Frequency and Duration min 2x/week  1 week       Prognosis Prognosis for Safe Diet Advancement: Fair      Swallow Study   General HPI: 82 y.o. male with hx of  Lewy body dementia and dysphagia with distal esophageal stricture noted on recent endoscopy.  Apparently he had food impaction last week, with endoscopy and food removal by Dr. Matthias Hughs; this endoscopy showed distal esophagitis but not obvious distal esophageal stricture. He was discharged home pre-procedure, but returned for worsening weakness and shortness of breath. He was found to have aspiration pneumonia, for which he has been treated. Respiratory status is improving. Barium swallow, to better assess for distal esophageal stricture, showed findings consistent with distal esophageal stricture. Pt is scheduled for f/u as OP with Dr. Bosie Clos on May 5.  Dr. Dulce Sellar recommends pureed diet; intensive antireflux measures.  Pt's wife reports 4/24 that pt tolerated POs better today; however he coughs frequently with liquids and has been doing so for several months.  Type of Study: Bedside Swallow Evaluation Diet Prior to this Study: Dysphagia 1 (puree);Thin liquids Temperature Spikes Noted: No Respiratory Status: Room air History of Recent Intubation: No Behavior/Cognition: Alert;Cooperative Oral Cavity Assessment: Within Functional Limits Oral Care Completed by SLP: No Oral Cavity - Dentition: Edentulous Vision: Functional for self-feeding Self-Feeding Abilities: Able to feed self Patient Positioning: Upright in bed Baseline Vocal Quality: Normal Volitional Cough: Strong Volitional Swallow: Unable to elicit    Oral/Motor/Sensory Function Overall Oral Motor/Sensory Function: Within functional limits   Ice Chips Ice chips: Not tested   Thin Liquid Thin Liquid: Impaired Presentation: Cup Pharyngeal  Phase Impairments: Cough - Immediate    Nectar Thick Nectar Thick Liquid: Within functional limits Presentation: Cup   Honey Thick Honey Thick Liquid: Not tested Presentation: Cup   Puree Puree: Within functional limits   Solid   GO   Solid: Not tested       Marchelle Folks L. Samson Frederic, [place]  CCC/SLP Pager [phone]  Blenda Mounts Laurice 03/29/2016,2:51 PM

## 2016-05-08 ENCOUNTER — Emergency Department (HOSPITAL_COMMUNITY): Payer: Medicare Other

## 2016-05-08 ENCOUNTER — Encounter (HOSPITAL_COMMUNITY)
Admission: EM | Disposition: A | Discharge status: Home / Self Care | Payer: Self-pay | Source: Home / Self Care | Attending: Emergency Medicine

## 2016-05-08 ENCOUNTER — Encounter (HOSPITAL_COMMUNITY): Payer: Self-pay | Admitting: Emergency Medicine

## 2016-05-08 ENCOUNTER — Ambulatory Visit (HOSPITAL_COMMUNITY)
Admission: EM | Admit: 2016-05-08 | Discharge: 2016-05-08 | Disposition: A | Discharge status: Home / Self Care | Payer: Medicare Other | Attending: Emergency Medicine | Admitting: Emergency Medicine

## 2016-05-08 DIAGNOSIS — Q396 Congenital diverticulum of esophagus: Secondary | ICD-10-CM | POA: Insufficient documentation

## 2016-05-08 DIAGNOSIS — Z7901 Long term (current) use of anticoagulants: Secondary | ICD-10-CM | POA: Insufficient documentation

## 2016-05-08 DIAGNOSIS — F0390 Unspecified dementia without behavioral disturbance: Secondary | ICD-10-CM | POA: Diagnosis not present

## 2016-05-08 DIAGNOSIS — Z79899 Other long term (current) drug therapy: Secondary | ICD-10-CM | POA: Diagnosis not present

## 2016-05-08 DIAGNOSIS — T18128A Food in esophagus causing other injury, initial encounter: Secondary | ICD-10-CM | POA: Insufficient documentation

## 2016-05-08 DIAGNOSIS — K208 Other esophagitis: Secondary | ICD-10-CM | POA: Insufficient documentation

## 2016-05-08 DIAGNOSIS — I10 Essential (primary) hypertension: Secondary | ICD-10-CM | POA: Diagnosis not present

## 2016-05-08 DIAGNOSIS — K219 Gastro-esophageal reflux disease without esophagitis: Secondary | ICD-10-CM | POA: Insufficient documentation

## 2016-05-08 DIAGNOSIS — Z87891 Personal history of nicotine dependence: Secondary | ICD-10-CM | POA: Diagnosis not present

## 2016-05-08 DIAGNOSIS — X58XXXA Exposure to other specified factors, initial encounter: Secondary | ICD-10-CM | POA: Diagnosis not present

## 2016-05-08 HISTORY — PX: ESOPHAGOGASTRODUODENOSCOPY: SHX5428

## 2016-05-08 LAB — BASIC METABOLIC PANEL
ANION GAP: 9 (ref 5–15)
BUN: 14 mg/dL (ref 6–20)
CHLORIDE: 105 mmol/L (ref 101–111)
CO2: 25 mmol/L (ref 22–32)
Calcium: 9.3 mg/dL (ref 8.9–10.3)
Creatinine, Ser: 0.92 mg/dL (ref 0.61–1.24)
GFR calc Af Amer: >60 mL/min (ref >60)
GLUCOSE: 146 mg/dL — AB (ref 65–99)
POTASSIUM: 3.8 mmol/L (ref 3.5–5.1)
Sodium: 139 mmol/L (ref 135–145)

## 2016-05-08 LAB — CBC WITH DIFFERENTIAL/PLATELET
BASOS ABS: 0 10*3/uL (ref 0.0–0.1)
Basophils Relative: 0 %
EOS PCT: 1 %
Eosinophils Absolute: 0.2 10*3/uL (ref 0.0–0.7)
HCT: 43.6 % (ref 39.0–52.0)
Hemoglobin: 14.2 g/dL (ref 13.0–17.0)
LYMPHS ABS: 2 10*3/uL (ref 0.7–4.0)
LYMPHS PCT: 17 %
MCH: 29.1 pg (ref 26.0–34.0)
MCHC: 32.6 g/dL (ref 30.0–36.0)
MCV: 89.3 fL (ref 78.0–100.0)
MONO ABS: 1 10*3/uL (ref 0.1–1.0)
Monocytes Relative: 8 %
NEUTROS ABS: 8.9 10*3/uL — AB (ref 1.7–7.7)
Neutrophils Relative %: 74 %
PLATELETS: 224 10*3/uL (ref 150–400)
RBC: 4.88 MIL/uL (ref 4.22–5.81)
RDW: 13.5 % (ref 11.5–15.5)
WBC: 12.2 10*3/uL — ABNORMAL HIGH (ref 4.0–10.5)

## 2016-05-08 SURGERY — EGD (ESOPHAGOGASTRODUODENOSCOPY)
Anesthesia: Moderate Sedation

## 2016-05-08 MED ORDER — MIDAZOLAM HCL 5 MG/ML IJ SOLN
INTRAMUSCULAR | Status: AC
  Filled 2016-05-08: qty 2

## 2016-05-08 MED ORDER — DIPHENHYDRAMINE HCL 50 MG/ML IJ SOLN
INTRAMUSCULAR | Status: AC
  Filled 2016-05-08: qty 1

## 2016-05-08 MED ORDER — MIDAZOLAM HCL 10 MG/2ML IJ SOLN
INTRAMUSCULAR | Status: DC | PRN
  Administered 2016-05-08: 2 mg via INTRAVENOUS

## 2016-05-08 MED ORDER — GLUCAGON HCL RDNA (DIAGNOSTIC) 1 MG IJ SOLR
1.0000 mg | Freq: Once | INTRAMUSCULAR | Status: AC
  Administered 2016-05-08: 1 mg via INTRAVENOUS
  Filled 2016-05-08: qty 1

## 2016-05-08 MED ORDER — SODIUM CHLORIDE 0.9 % IV SOLN: INTRAVENOUS | Status: DC

## 2016-05-08 MED ORDER — FENTANYL CITRATE (PF) 100 MCG/2ML IJ SOLN
INTRAMUSCULAR | Status: DC | PRN
  Administered 2016-05-08: 25 ug via INTRAVENOUS

## 2016-05-08 MED ORDER — FENTANYL CITRATE (PF) 100 MCG/2ML IJ SOLN
INTRAMUSCULAR | Status: AC
  Filled 2016-05-08: qty 2

## 2016-05-08 NOTE — Progress Notes (Signed)
Patient ID: Jeremy BerkshireRoger N Garciamartinez, male   DOB: 11-Dec-1933, 82 y.o.   MRN: [id number] [facility]  Jeremy BerkshireRoger N Engh 82 y.o. 11-Dec-1933   Subjective: Ate pureed diet (at home) yesterday for dinner and was unable to swallow saliva or food or liquids after that. Has been regurgitating fluids but wife denies that any food has come up. Patient is demented and cannot give me any history. Food impaction in April 2017 that was cleared with EGD. Denies abdominal pain.  Objective: Vital signs in last 24 hours: Filed Vitals:   05/08/16 1405 05/08/16 1412  BP: 128/64 135/66  Pulse: 55 64  Temp:    Resp: 21 16    Physical Exam: Gen: awake, demented, well-nourished HEENT: anicteric sclera CV: RRR Chest: CTA B Abd: soft, nontender, nondistended, +BS Ext: no edema  Lab Results:  Recent Labs  05/08/16 0630  NA 139  K 3.8  CL 105  CO2 25  GLUCOSE 146*  BUN 14  CREATININE 0.92  CALCIUM 9.3   No results for input(s): AST, ALT, ALKPHOS, BILITOT, PROT, ALBUMIN in the last 72 hours.  Recent Labs  05/08/16 0824  WBC 12.2*  NEUTROABS 8.9*  HGB 14.2  HCT 43.6  MCV 89.3  PLT 224   No results for input(s): LABPROT, INR in the last 72 hours.    Assessment/Plan: Food impaction and distal erosive esophagitis seen during food impaction in April 2017. Bedside EGD to clear food bolus. IVFs. Supportive care.   Tyrica Afzal C. 05/08/2016, 2:14 PM  Pager [phone]  If no answer or after 5 PM call [phone]

## 2016-05-08 NOTE — H&P (View-Only) (Signed)
Patient ID: Jeremy Briggs, male   DOB: 08/30/1934, 82 y.o.   MRN: [id number] [facility] Progress Note  Jeremy Briggs 82 y.o. 03/10/1934   Subjective: Ate pureed diet (at home) yesterday for dinner and was unable to swallow saliva or food or liquids after that. Has been regurgitating fluids but wife denies that any food has come up. Patient is demented and cannot give me any history. Food impaction in April 2017 that was cleared with EGD. Denies abdominal pain.  Objective: Vital signs in last 24 hours: Filed Vitals:   05/08/16 1405 05/08/16 1412  BP: 128/64 135/66  Pulse: 55 64  Temp:    Resp: 21 16    Physical Exam: Gen: awake, demented, well-nourished HEENT: anicteric sclera CV: RRR Chest: CTA B Abd: soft, nontender, nondistended, +BS Ext: no edema  Lab Results:  Recent Labs  05/08/16 0630  NA 139  K 3.8  CL 105  CO2 25  GLUCOSE 146*  BUN 14  CREATININE 0.92  CALCIUM 9.3   No results for input(s): AST, ALT, ALKPHOS, BILITOT, PROT, ALBUMIN in the last 72 hours.  Recent Labs  05/08/16 0824  WBC 12.2*  NEUTROABS 8.9*  HGB 14.2  HCT 43.6  MCV 89.3  PLT 224   No results for input(s): LABPROT, INR in the last 72 hours.    Assessment/Plan: Food impaction and distal erosive esophagitis seen during food impaction in April 2017. Bedside EGD to clear food bolus. IVFs. Supportive care.   Gershon Shorten C. 05/08/2016, 2:14 PM  Pager [phone]  If no answer or after 5 PM call [phone]  

## 2016-05-08 NOTE — ED Notes (Signed)
MD at bedside. 

## 2016-05-08 NOTE — Op Note (Signed)
[facility] Patient Name: Jeremy BitterRoger Mcbrayer Procedure Date : 05/08/2016 MRN: [id number] Attending MD: Shirley FriarVincent C Shaili Donalson , MD Date of Birth: 09-May-1934 CSN: [id number] Age: 82 Admit Type: Outpatient Procedure:                Upper GI endoscopy Indications:              Removal of foreign body in the esophagus Providers:                Shirley FriarVincent C. Lijah Bourque, MD, Omelia BlackwaterShelby Carpenter, RN, Lorenda IshiharaSam                            Tetteh, Technician Referring MD:              Medicines:                Fentanyl 25 micrograms IV, Midazolam 2 mg IV Complications:            No immediate complications. Estimated Blood Loss:     Estimated blood loss was minimal. Procedure:                Pre-Anesthesia Assessment:                           - Prior to the procedure, a History and Physical                            was performed, and patient medications and                            allergies were reviewed. The patient's tolerance of                            previous anesthesia was also reviewed. The risks                            and benefits of the procedure and the sedation                            options and risks were discussed with the patient.                            All questions were answered, and informed consent                            was obtained. Prior Anticoagulants: The patient has                            taken Eliquis (apixaban). ASA Grade Assessment: III                            - A patient with severe systemic disease. After                            reviewing the risks and benefits, the patient was  deemed in satisfactory condition to undergo the                            procedure.                           After obtaining informed consent, the endoscope was                            passed under direct vision. Throughout the                            procedure, the patient's blood pressure, pulse, and   oxygen saturations were monitored continuously. The                            EG-[id number] ([id number]) scope was introduced through the                            mouth, and advanced to the second part of duodenum.                            The upper GI endoscopy was somewhat difficult due                            to presence of food. Successful completion of the                            procedure was aided by performing the maneuvers                            documented (below) in this report. The patient                            tolerated the procedure. Scope In: Scope Out: Findings:      Food was found in the lower third of the esophagus and at the       gastroesophageal junction. Removal of food was accomplished.      LA Grade D (one or more mucosal breaks involving at least 75% of       esophageal circumference) esophagitis with bleeding was found at the       gastroesophageal junction.      The entire examined stomach was normal.      The examined duodenum was normal.      A non-bleeding diverticulum with a small opening and no stigmata of       recent bleeding was found at the gastroesophageal junction. Impression:               - Food in the lower third of the esophagus and at                            the gastroesophageal junction. Removal was                            successful.                           -  LA Grade D acute and erosive esophagitis.                           - Normal stomach.                           - Normal examined duodenum.                           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 Moderate Sedation:      Moderate (conscious) sedation was administered by the endoscopy nurse       and supervised by the endoscopist. The following parameters were       monitored: oxygen saturation, heart rate, blood pressure, and response       to care. Recommendation:           - Use Prilosec (omeprazole) 40 mg PO BID.                           -  Clear liquid diet today and if tolerates then do                            full liquids tomorrow. Advance to pureed only diet                            on 05/10/16.                           - If dysphagia occurs on pureed diet, then may need                            a reevaluation of the esophagus with possible                            dilation in 4-6 weeks otherwise, will hold off on                            repeat EGD with dilation if tolerates a pureed diet.                           - Continue present medications. 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 All rights reserved. The codes documented in this report are preliminary and upon coder review may  be revised to meet current compliance requirements. Shirley Friar, MD 05/08/2016 5:08:13 PM This report has been signed electronically. Number of Addenda: 0

## 2016-05-08 NOTE — ED Notes (Signed)
Pt had endoscopy 3-4 weeks ago.  He is on a soft food diet.  Wife reports since eating dinner he feels like something is stuck in esophagus.  Pt is spitting secretions in a bag.  Denies pain.

## 2016-05-08 NOTE — ED Provider Notes (Signed)
3:15 PM GI, Dr. Bosie ClosSchooler, has done EGD and removed food impaction. Patient tolerated well. D/c home per GI, clear liquids x 2 days. F/u with GI  Pricilla LovelessScott Elbert Spickler, MD 05/08/16 1515

## 2016-05-08 NOTE — ED Notes (Signed)
Lab called and reported CBC was contaminated. Will redraw.

## 2016-05-08 NOTE — ED Provider Notes (Signed)
CSN: [id number]     Arrival date & time 05/08/16  0444 History   First MD Initiated Contact with Patient 05/08/16 [phone]     Chief Complaint  Patient presents with  . food stuck in esophagus      Level V caveat due to dementia. The history is provided by the patient.  Patient was brought in by wife with spitting up. States that he has had food get stuck in the past. At around 7:00 last night he ate rice and some kind of work. Also had some applesauce with meds. Since then has been spitting up his spit. No vomiting. States he did have some applesauce stayed down. He has had meat get stuck in the past and last scoping was around 2 months ago. He had to be intubated for that one. Patient is on elliquis also.  Past Medical History  Diagnosis Date  . Esophageal stricture   . Hypertension   . Hyperlipemia   . GERD (gastroesophageal reflux disease)   . Dementia   . Arthritis    Past Surgical History  Procedure Laterality Date  . Esophageal stret    . Esophagogastroduodenoscopy  01/27/2012    Procedure: ESOPHAGOGASTRODUODENOSCOPY (EGD);  Surgeon: Petra Kuba, MD;  Location: [facility] ENDOSCOPY;  Service: Endoscopy;  Laterality: N/A;  . Esophagogastroduodenoscopy N/A 08/13/2013    Procedure: ESOPHAGOGASTRODUODENOSCOPY (EGD);  Surgeon: Vertell Novak., MD;  [facility];  Service: Endoscopy;  Laterality: N/A;  . Foreign body removal N/A 08/13/2013    Procedure: FOREIGN BODY REMOVAL;  Surgeon: Vertell Novak., MD;  Location: [facility] ENDOSCOPY;  Service: Endoscopy;  Laterality: N/A;  . Cardioversion N/A 09/13/2013    Procedure: CARDIOVERSION;  Surgeon: Othella Boyer, MD;  [facility];  Service: Cardiovascular;  Laterality: N/A;  . Esophagogastroduodenoscopy N/A 03/24/2016    Procedure: ESOPHAGOGASTRODUODENOSCOPY (EGD);  Surgeon: Bernette Redbird, MD;  Location[facility] ENDOSCOPY;  Service: Endoscopy;  Laterality: N/A;  . Esophagogastroduodenoscopy (egd) with propofol  03/24/2016   Procedure: ESOPHAGOGASTRODUODENOSCOPY (EGD) WITH PROPOFOL;  Surgeon: Bernette Redbird, MD;  Location: [facility] ENDOSCOPY;  Service: Endoscopy;;   Family History  Problem Relation Age of Onset  . Alzheimer's disease Father   . Dementia Mother   . Cancer Daughter    Social History  Substance Use Topics  . Smoking status: Former Smoker -- 3.00 packs/day for 50 years    Types: Cigarettes    Quit date: 12/14/1998  . Smokeless tobacco: Never Used  . Alcohol Use: No     Comment: daily    Review of Systems  Unable to perform ROS: Dementia      Allergies  Lipitor; Statins; Zetia; and Ambien  Home Medications   Prior to Admission medications   Medication Sig Start Date End Date Taking? Authorizing Provider  apixaban (ELIQUIS) 5 MG TABS tablet Take 1 tablet (5 mg total) by mouth 2 (two) times daily. 08/14/13  Yes Elease Etienne, MD  citalopram (CELEXA) 40 MG tablet Take 40 mg by mouth every morning.    Yes Historical Provider, MD  clonazePAM (KLONOPIN) 0.5 MG tablet Take 1 tablet (0.5 mg total) by mouth at bedtime. Patient taking differently: Take 1 mg by mouth at bedtime.  03/26/14  Yes Suanne Marker, MD  donepezil (ARICEPT) 10 MG tablet Take 1 tablet (10 mg total) by mouth at bedtime. 03/26/14  Yes Suanne Marker, MD  memantine (NAMENDA) 5 MG tablet Take 5 mg by mouth every morning.   Yes Historical Provider,  MD  omeprazole (PRILOSEC) 20 MG capsule Take 2 capsules (40 mg total) by mouth 2 (two) times daily before a meal. 03/24/16  Yes Bernette Redbirdobert Buccini, MD   BP 127/66 mmHg  Pulse 51  Temp(Src) 98.6 F (37 C) (Oral)  Resp 17  SpO2 95% Physical Exam  Constitutional: He appears well-developed.  HENT:  Head: Atraumatic.  Cardiovascular: Normal rate.   Pulmonary/Chest: Effort normal.  Neurological: He is alert.  Awake and pleasant with some dementia.  Skin: Skin is warm.    ED Course  Procedures (including critical care time) Labs Review Labs Reviewed  CBC WITH  DIFFERENTIAL/PLATELET  BASIC METABOLIC PANEL    Imaging Review Dg Chest 2 View  05/08/2016  CLINICAL DATA:  Esophageal impaction. EXAM: CHEST  2 VIEW COMPARISON:  03/26/2016 FINDINGS: No evidence of aspiration or air leak. Normal heart size and mediastinal contours. Triangular opacity overlapping the cardiac apex in the lateral projection is fat pad based on 11/24/2014 CT. Ovoid calcific density over the right mid lung is rib bifurcation with chondral ossification. No acute osseous finding. IMPRESSION: Stable exam.  No acute finding. Electronically Signed   By: Marnee SpringJonathon  Watts M.D.   On: 05/08/2016 06:28   I have personally reviewed and evaluated these images and lab results as part of my medical decision-making.   EKG Interpretation None      MDM   Final diagnoses:  Food impaction of esophagus, initial encounter    Patient with likely food impaction. Not tolerating his secretions. [facility]   Benjiman CoreNathan Alois Colgan, MD 05/08/16 [phone]

## 2016-05-08 NOTE — Interval H&P Note (Signed)
History and Physical Interval Note:  05/08/2016 2:19 PM  Jeremy Briggs  has presented today for surgery, with the diagnosis of Food Impaction  The various methods of treatment have been discussed with the patient and family. After consideration of risks, benefits and other options for treatment, the patient has consented to  Procedure(s): ESOPHAGOGASTRODUODENOSCOPY (EGD) (N/A) as a surgical intervention .  The patient's history has been reviewed, patient examined, no change in status, stable for surgery.  I have reviewed the patient's chart and labs.  Questions were answered to the patient's satisfaction.     [name] C.

## 2016-05-08 NOTE — Discharge Instructions (Signed)
Clear liquid diet only today. Starting tomorrow ok to do soups and broth and if tolerates then start pureed diet only on Monday 05/10/16.

## 2016-05-08 NOTE — ED Notes (Signed)
Recollected BMP, sent down at this time

## 2016-05-09 ENCOUNTER — Encounter (HOSPITAL_COMMUNITY): Payer: Self-pay | Admitting: Gastroenterology

## 2016-06-17 ENCOUNTER — Encounter: Payer: Self-pay | Admitting: Podiatry

## 2016-06-17 ENCOUNTER — Ambulatory Visit (INDEPENDENT_AMBULATORY_CARE_PROVIDER_SITE_OTHER): Payer: Medicare Other | Admitting: Podiatry

## 2016-06-17 DIAGNOSIS — M79676 Pain in unspecified toe(s): Secondary | ICD-10-CM | POA: Diagnosis not present

## 2016-06-17 DIAGNOSIS — B351 Tinea unguium: Secondary | ICD-10-CM | POA: Diagnosis not present

## 2016-06-20 NOTE — Progress Notes (Signed)
Patient ID: Jeremy Briggs, male   DOB: January 11, 1934, 82 y.o.   MRN: [id number]  Subjective: 82 y.o. returns the office today for painful, elongated, thickened toenails which he cannot trim himself. Denies any redness or drainage around the nails. Denies any acute changes since last appointment and no new complaints today. Denies any systemic complaints such as fevers, chills, nausea, vomiting.   Objective: AAO 3, NAD DP/PT pulses palpable, CRT less than 3 seconds Nails hypertrophic, dystrophic, elongated, brittle, discolored 10. There is tenderness overlying the nails 1-5 bilaterally. There is no surrounding erythema or drainage along the nail sites. No open lesions or pre-ulcerative lesions are identified. No other areas of tenderness bilateral lower extremities. No overlying edema, erythema, increased warmth. No pain with calf compression, swelling, warmth, erythema.  Assessment: Patient presents with symptomatic onychomycosis  Plan: -Treatment options including alternatives, risks, complications were discussed -Nails sharply debrided 10 without complication/bleeding. -Discussed daily foot inspection. If there are any changes, to call the office immediately.  -Follow-up in 3 months or sooner if any problems are to arise. In the meantime, encouraged to call the office with any questions, concerns, changes symptoms.  Ovid CurdMatthew Pinkey Mcjunkin, DPM

## 2016-07-20 ENCOUNTER — Emergency Department (HOSPITAL_COMMUNITY): Payer: Medicare Other

## 2016-07-20 ENCOUNTER — Emergency Department (HOSPITAL_COMMUNITY): Payer: Medicare Other | Admitting: Anesthesiology

## 2016-07-20 ENCOUNTER — Encounter (HOSPITAL_COMMUNITY)
Admission: EM | Disposition: A | Discharge status: Home / Self Care | Payer: Self-pay | Source: Home / Self Care | Attending: Emergency Medicine

## 2016-07-20 ENCOUNTER — Encounter (HOSPITAL_COMMUNITY): Payer: Self-pay | Admitting: *Deleted

## 2016-07-20 ENCOUNTER — Ambulatory Visit (HOSPITAL_COMMUNITY)
Admission: EM | Admit: 2016-07-20 | Discharge: 2016-07-20 | Disposition: A | Discharge status: Home / Self Care | Payer: Medicare Other | Attending: Emergency Medicine | Admitting: Emergency Medicine

## 2016-07-20 DIAGNOSIS — X58XXXA Exposure to other specified factors, initial encounter: Secondary | ICD-10-CM | POA: Insufficient documentation

## 2016-07-20 DIAGNOSIS — I4891 Unspecified atrial fibrillation: Secondary | ICD-10-CM | POA: Insufficient documentation

## 2016-07-20 DIAGNOSIS — F0280 Dementia in other diseases classified elsewhere without behavioral disturbance: Secondary | ICD-10-CM | POA: Insufficient documentation

## 2016-07-20 DIAGNOSIS — Z87891 Personal history of nicotine dependence: Secondary | ICD-10-CM | POA: Diagnosis not present

## 2016-07-20 DIAGNOSIS — I10 Essential (primary) hypertension: Secondary | ICD-10-CM | POA: Insufficient documentation

## 2016-07-20 DIAGNOSIS — I4892 Unspecified atrial flutter: Secondary | ICD-10-CM | POA: Diagnosis not present

## 2016-07-20 DIAGNOSIS — Z79899 Other long term (current) drug therapy: Secondary | ICD-10-CM | POA: Diagnosis not present

## 2016-07-20 DIAGNOSIS — G3183 Dementia with Lewy bodies: Secondary | ICD-10-CM | POA: Diagnosis not present

## 2016-07-20 DIAGNOSIS — Z7901 Long term (current) use of anticoagulants: Secondary | ICD-10-CM | POA: Diagnosis not present

## 2016-07-20 DIAGNOSIS — K219 Gastro-esophageal reflux disease without esophagitis: Secondary | ICD-10-CM | POA: Diagnosis not present

## 2016-07-20 DIAGNOSIS — T189XXA Foreign body of alimentary tract, part unspecified, initial encounter: Secondary | ICD-10-CM | POA: Diagnosis present

## 2016-07-20 DIAGNOSIS — T18128A Food in esophagus causing other injury, initial encounter: Secondary | ICD-10-CM

## 2016-07-20 HISTORY — PX: ESOPHAGOGASTRODUODENOSCOPY: SHX5428

## 2016-07-20 SURGERY — EGD (ESOPHAGOGASTRODUODENOSCOPY)
Anesthesia: Monitor Anesthesia Care

## 2016-07-20 SURGERY — EGD (ESOPHAGOGASTRODUODENOSCOPY)
Anesthesia: General

## 2016-07-20 MED ORDER — SUCCINYLCHOLINE CHLORIDE 20 MG/ML IJ SOLN
INTRAMUSCULAR | Status: DC | PRN
  Administered 2016-07-20: 80 mg via INTRAVENOUS

## 2016-07-20 MED ORDER — ONDANSETRON HCL 4 MG/2ML IJ SOLN
INTRAMUSCULAR | Status: DC | PRN
  Administered 2016-07-20: 4 mg via INTRAVENOUS

## 2016-07-20 MED ORDER — LACTATED RINGERS IV SOLN: INTRAVENOUS | Status: DC

## 2016-07-20 MED ORDER — FENTANYL CITRATE (PF) 100 MCG/2ML IJ SOLN: 25.0000 ug | INTRAMUSCULAR | Status: DC | PRN

## 2016-07-20 MED ORDER — PROPOFOL 10 MG/ML IV BOLUS
INTRAVENOUS | Status: DC | PRN
  Administered 2016-07-20: 120 mg via INTRAVENOUS

## 2016-07-20 MED ORDER — LACTATED RINGERS IV SOLN
INTRAVENOUS | Status: DC | PRN
  Administered 2016-07-20: 19:00:00 via INTRAVENOUS

## 2016-07-20 MED ORDER — SUGAMMADEX SODIUM 200 MG/2ML IV SOLN
INTRAVENOUS | Status: DC | PRN
  Administered 2016-07-20: 200 mg via INTRAVENOUS

## 2016-07-20 MED ORDER — SUFENTANIL CITRATE 50 MCG/ML IV SOLN
INTRAVENOUS | Status: AC
  Filled 2016-07-20: qty 1

## 2016-07-20 MED ORDER — SODIUM CHLORIDE 0.9 % IV SOLN: INTRAVENOUS | Status: DC

## 2016-07-20 MED ORDER — GLUCAGON HCL RDNA (DIAGNOSTIC) 1 MG IJ SOLR
1.0000 mg | Freq: Once | INTRAMUSCULAR | Status: AC
  Administered 2016-07-20: 1 mg via INTRAVENOUS
  Filled 2016-07-20: qty 1

## 2016-07-20 MED ORDER — ROCURONIUM BROMIDE 100 MG/10ML IV SOLN
INTRAVENOUS | Status: DC | PRN
  Administered 2016-07-20: 10 mg via INTRAVENOUS
  Administered 2016-07-20: 20 mg via INTRAVENOUS

## 2016-07-20 MED ORDER — MIDAZOLAM HCL 2 MG/2ML IJ SOLN
INTRAMUSCULAR | Status: AC
  Filled 2016-07-20: qty 2

## 2016-07-20 NOTE — Anesthesia Postprocedure Evaluation (Signed)
Anesthesia Post Note  Patient: Effie BerkshireRoger N Oetken  Procedure(s) Performed: Procedure(s) (LRB): ESOPHAGOGASTRODUODENOSCOPY (EGD) (N/A)  Patient location during evaluation: PACU Anesthesia Type: General Level of consciousness: awake and alert Pain management: pain level controlled Vital Signs Assessment: post-procedure vital signs reviewed and stable Respiratory status: spontaneous breathing, nonlabored ventilation, respiratory function stable and patient connected to nasal cannula oxygen Cardiovascular status: blood pressure returned to baseline and stable Postop Assessment: no signs of nausea or vomiting Anesthetic complications: no    Last Vitals:  Vitals:   07/20/16 2050 07/20/16 2054  BP: (!) 118/49 109/76  Pulse: 69 70  Resp: 15 (!) 21  Temp:      Last Pain:  Vitals:   07/20/16 2054  TempSrc:   PainSc: 0-No pain                 Reino KentJudd, Felicite Zeimet J

## 2016-07-20 NOTE — ED Triage Notes (Signed)
Pt has food stuck in throat. [facility] Pt was eating corn and pork. Wife states he is suppose to be on a puree diet.

## 2016-07-20 NOTE — Discharge Instructions (Addendum)
CONTINUE PUREED DIET.    CONTACT DR. Bosie ClosSCHOOLER REGARDING POSSIBLE FOLLOW-UP ENDOSCOPY WITH ESOPHAGEAL DILATATION (STRETCHING).  RETURN TO EMERGENCY ROOM IF FEVER, PAIN, OR BREATHING DIFFICULTIES TONIGHT.

## 2016-07-20 NOTE — Anesthesia Procedure Notes (Signed)
Procedure Name: Intubation Date/Time: 07/20/2016 7:39 PM Performed by: Orley Lawry S Pre-anesthesia Checklist: Patient identified, Emergency Drugs available, Suction available, Patient being monitored and Timeout performed Patient Re-evaluated:Patient Re-evaluated prior to inductionOxygen Delivery Method: Circle system utilized Preoxygenation: Pre-oxygenation with 100% oxygen Intubation Type: IV induction, Rapid sequence and Cricoid Pressure applied Laryngoscope Size: Mac and 4 Grade View: Grade I Tube type: Oral Tube size: 7.5 mm Number of attempts: 1 Airway Equipment and Method: Stylet Placement Confirmation: ETT inserted through vocal cords under direct vision,  positive ETCO2 and breath sounds checked- equal and bilateral Secured at: 22 cm Tube secured with: Tape Dental Injury: Teeth and Oropharynx as per pre-operative assessment

## 2016-07-20 NOTE — H&P (Signed)
Jeremy Briggs is an 82 y.o. male.   Chief Complaint: food impaction (recurrent)  HPI: Pt w/ h/o dementia and known esoph ring and h/o recurr fd impactions (April, June 2017) now presents w/ recurrent fd impaction after being given non-pureed food at a senior center during lunch about 6 hrs ago.  Has been unable to handle secretions since, and did not respond to trial of IV glucagon in ER.  Pt did not tolerate attempt at endoscopic treatment of fd impaction under moderate sedation in 03/2016 (altho he did in 05/2016).  Pt is on Eloquis for h/o ? A. Fib--last dose this morning  Denies CP, pain, SOB  Past Medical History:  Diagnosis Date  . Arthritis   . Dementia   . Esophageal stricture   . GERD (gastroesophageal reflux disease)   . Hyperlipemia   . Hypertension     Past Surgical History:  Procedure Laterality Date  . CARDIOVERSION N/A 09/13/2013   Procedure: CARDIOVERSION;  Surgeon: Othella BoyerWilliam S Tilley, MD;  [facility];  Service: Cardiovascular;  Laterality: N/A;  . esophageal stret    . ESOPHAGOGASTRODUODENOSCOPY  01/27/2012   Procedure: ESOPHAGOGASTRODUODENOSCOPY (EGD);  Surgeon: Petra KubaMarc E Magod, MD;  [facility];  Service: Endoscopy;  Laterality: N/A;  . ESOPHAGOGASTRODUODENOSCOPY N/A 08/13/2013   Procedure: ESOPHAGOGASTRODUODENOSCOPY (EGD);  Surgeon: Vertell NovakJames L Edwards Jr., MD;  [facility];  Service: Endoscopy;  Laterality: N/A;  . ESOPHAGOGASTRODUODENOSCOPY N/A 03/24/2016   Procedure: ESOPHAGOGASTRODUODENOSCOPY (EGD);  Surgeon: Bernette Redbirdobert Galen Malkowski, MD;  [facility];  Service: Endoscopy;  Laterality: N/A;  . ESOPHAGOGASTRODUODENOSCOPY N/A 05/08/2016   Procedure: ESOPHAGOGASTRODUODENOSCOPY (EGD);  Surgeon: Charlott RakesVincent Schooler, MD;  [facility];  Service: Endoscopy;  Laterality: N/A;  . ESOPHAGOGASTRODUODENOSCOPY (EGD) WITH PROPOFOL  03/24/2016   Procedure: ESOPHAGOGASTRODUODENOSCOPY (EGD) WITH PROPOFOL;  Surgeon: Bernette Redbirdobert Tyjay Galindo, MD;  [facility];  Service: Endoscopy;;  . FOREIGN BODY REMOVAL N/A 08/13/2013   Procedure: FOREIGN BODY REMOVAL;  Surgeon: Vertell NovakJames L Edwards Jr., MD;  [facility];  Service: Endoscopy;  Laterality: N/A;    Family History  Problem Relation Age of Onset  . Alzheimer's disease Father   . Dementia Mother   . Cancer Daughter    Social History:  reports that he quit smoking about 17 years ago. His smoking use included Cigarettes. He has a 150.00 pack-year smoking history. He has never used smokeless tobacco. He reports that he does not drink alcohol or use drugs.  Allergies:  Allergies  Allergen Reactions  . Lipitor [Atorvastatin] Other (See Comments)    Goes crazy  . Statins Other (See Comments)    Goes crazy  . Zetia [Ezetimibe] Other (See Comments)    Angry, violent mannerisms  . Ambien [Zolpidem Tartrate] Other (See Comments)    unknown    Medications Prior to Admission  Medication Sig Dispense Refill  . apixaban (ELIQUIS) 5 MG TABS tablet Take 1 tablet (5 mg total) by mouth 2 (two) times daily. 60 tablet 0  . citalopram (CELEXA) 40 MG tablet Take 40 mg by mouth every morning.     . clonazePAM (KLONOPIN) 0.5 MG tablet Take 1 tablet (0.5 mg total) by mouth at bedtime. (Patient taking differently: Take 1 mg by mouth at bedtime. ) 30 tablet 5  . donepezil (ARICEPT) 10 MG tablet Take 1 tablet (10 mg total) by mouth at bedtime. 30 tablet 12  . memantine (NAMENDA) 5 MG tablet Take 5 mg by mouth every morning.    Marland Kitchen. omeprazole (PRILOSEC) 20 MG  capsule Take 2 capsules (40 mg total) by mouth 2 (two) times daily before a meal. 60 capsule 0    No results found for this or any previous visit (from the past 48 hour(s)). Dg Chest 2 View  Result Date: 07/20/2016 CLINICAL DATA:  Possible food bolus within the esophagus EXAM: CHEST  2 VIEW COMPARISON:  05/08/2016 FINDINGS: Cardiac shadow is within normal limits. No focal infiltrate or sizable effusion is noted. Aortic calcifications are again  seen. Calcified pleural parenchymal scarring is again noted. IMPRESSION: No acute abnormality seen. Electronically Signed   By: Alcide CleverMark  Lukens M.D.   On: 07/20/2016 16:16    ROS  See HPI  Blood pressure (!) 162/90, pulse 62, temperature 99 F (37.2 C), temperature source Oral, resp. rate 16, height 5' 7.5" (1.715 m), weight 81.6 kg (180 lb), SpO2 96 %. Physical Exam   Well-nourished, pleasant but relatively non-verbal elderly male in NAD, spitting out clear secretions periodically.  Chest clear, heart rhythm apparently regular w/ nl rate, abd mildly obese w/out mass or tenderness.  No overt focal neuro deficits.  Assessment/Plan Will proceed to endoscopic mgt of (presumed) esoph foreign body under general endotracheal anesth (to protect airway, based on some degree of respir instability during attempted egd under moderate sedn 03/2016).  Have discussed w/ wife who is at bedside and is agreeable.  Florencia ReasonsBUCCINI,Fenton Candee V, MD 07/20/2016, 7:30 PM

## 2016-07-20 NOTE — ED Triage Notes (Signed)
Pt has hx of food impactions and endoscopies

## 2016-07-20 NOTE — Anesthesia Preprocedure Evaluation (Signed)
Anesthesia Evaluation  Patient identified by MRN, date of birth, ID band Patient confused    Reviewed: Allergy & Precautions, NPO status , Patient's Chart, lab work & pertinent test results  Airway Mallampati: II  TM Distance: >3 FB Neck ROM: Full    Dental  (+) Dental Advisory Given, Edentulous Upper, Edentulous Lower   Pulmonary former smoker,    Pulmonary exam normal breath sounds clear to auscultation       Cardiovascular hypertension, Pt. on medications Normal cardiovascular exam+ dysrhythmias Atrial Fibrillation  Rhythm:Regular Rate:Normal     Neuro/Psych PSYCHIATRIC DISORDERS lewy body dementia     GI/Hepatic Neg liver ROS, GERD  Medicated,Food impaction in esophagus   Endo/Other  negative endocrine ROS  Renal/GU negative Renal ROS     Musculoskeletal  (+) Arthritis , Osteoarthritis,    Abdominal   Peds  Hematology negative hematology ROS (+)   Anesthesia Other Findings   Reproductive/Obstetrics                             Anesthesia Physical  Anesthesia Plan  ASA: III  Anesthesia Plan: General   Post-op Pain Management:    Induction: Intravenous and Rapid sequence  Airway Management Planned: Oral ETT  Additional Equipment:   Intra-op Plan:   Post-operative Plan: Extubation in OR  Informed Consent: I have reviewed the patients History and Physical, chart, labs and discussed the procedure including the risks, benefits and alternatives for the proposed anesthesia with the patient or authorized representative who has indicated his/her understanding and acceptance.   Dental advisory given  Plan Discussed with: CRNA and Anesthesiologist  Anesthesia Plan Comments:         Anesthesia Quick Evaluation

## 2016-07-20 NOTE — ED Provider Notes (Signed)
[facility] Provider Note   CSN: [id number] Arrival date & time: 07/20/16  1434     History   Chief Complaint Chief Complaint  Patient presents with  . Swallowed Foreign Body    HPI Jeremy Briggs is a 82 y.o. male.  82 year old male with past medical history including dementia, esophageal stricture and multiple food impactions, hypertension who presents with food impaction. History obtained from wife, who states that around noon today, the patient was [facility] the [facility] having lunch when he felt that some corn and pork got stuck in his throat. He is supposed to be on a pureed diet. Since then, he has been spitting his secretions and has been unable to swallow anything. He denies any pain, no episodes of vomiting. She states that this is exactly like previous episodes of food impaction requiring endoscopy.  LEVEL 5 CAVEAT DUE TO DEMENTIA   The history is provided by the spouse.  Swallowed Foreign Body     Past Medical History:  Diagnosis Date  . Arthritis   . Dementia   . Esophageal stricture   . GERD (gastroesophageal reflux disease)   . Hyperlipemia   . Hypertension     Patient Active Problem List   Diagnosis Date Noted  . Aspiration pneumonia (HCC) 03/27/2016  . Prolonged QT interval 03/27/2016  . Aspiration pneumonia of left lower lobe (HCC)   . Dementia with Lewy bodies 12/21/2013  . Current use of long term anticoagulation   . Atherosclerosis   . Atrial flutter 08/13/2013  . Multiple duodenal ulcers 08/13/2013  . Food impaction of esophagus, S/P EGD 9/8 08/13/2013  . Hypertension   . Hyperlipemia   . GERD (gastroesophageal reflux disease)   . Dementia     Past Surgical History:  Procedure Laterality Date  . CARDIOVERSION N/A 09/13/2013   Procedure: CARDIOVERSION;  Surgeon: Othella BoyerWilliam S Tilley, MD;  [facility];  Service: Cardiovascular;  Laterality: N/A;  . esophageal stret    . ESOPHAGOGASTRODUODENOSCOPY  01/27/2012   Procedure: ESOPHAGOGASTRODUODENOSCOPY (EGD);  Surgeon: Petra KubaMarc E Magod, MD;  [facility];  Service: Endoscopy;  Laterality: N/A;  . ESOPHAGOGASTRODUODENOSCOPY N/A 08/13/2013   Procedure: ESOPHAGOGASTRODUODENOSCOPY (EGD);  Surgeon: Vertell NovakJames L Edwards Jr., MD;  [facility];  Service: Endoscopy;  Laterality: N/A;  . ESOPHAGOGASTRODUODENOSCOPY N/A 03/24/2016   Procedure: ESOPHAGOGASTRODUODENOSCOPY (EGD);  Surgeon: Bernette Redbirdobert Buccini, MD;  [facility];  Service: Endoscopy;  Laterality: N/A;  . ESOPHAGOGASTRODUODENOSCOPY N/A 05/08/2016   Procedure: ESOPHAGOGASTRODUODENOSCOPY (EGD);  Surgeon: Charlott RakesVincent Schooler, MD;  [facility];  Service: Endoscopy;  Laterality: N/A;  . ESOPHAGOGASTRODUODENOSCOPY (EGD) WITH PROPOFOL  03/24/2016   Procedure: ESOPHAGOGASTRODUODENOSCOPY (EGD) WITH PROPOFOL;  Surgeon: Bernette Redbirdobert Buccini, MD;  [facility];  Service: Endoscopy;;  . FOREIGN BODY REMOVAL N/A 08/13/2013   Procedure: FOREIGN BODY REMOVAL;  Surgeon: Vertell NovakJames L Edwards Jr., MD;  [facility];  Service: Endoscopy;  Laterality: N/A;       Home Medications    Prior to Admission medications   Medication Sig Start Date End Date Taking? Authorizing Provider  apixaban (ELIQUIS) 5 MG TABS tablet Take 1 tablet (5 mg total) by mouth 2 (two) times daily. 08/14/13   Elease EtienneAnand D Hongalgi, MD  citalopram (CELEXA) 40 MG tablet Take 40 mg by mouth every morning.     Historical Provider, MD  clonazePAM (KLONOPIN) 0.5 MG tablet Take 1 tablet (0.5 mg total) by mouth at bedtime. Patient taking differently: Take 1 mg by mouth at bedtime.  03/26/14  Suanne Marker, MD  donepezil (ARICEPT) 10 MG tablet Take 1 tablet (10 mg total) by mouth at bedtime. 03/26/14   Suanne Marker, MD  memantine (NAMENDA) 5 MG tablet Take 5 mg by mouth every morning.    Historical Provider, MD  omeprazole (PRILOSEC) 20 MG capsule Take 2 capsules (40 mg total) by mouth 2 (two) times daily before a meal. 03/24/16    Bernette Redbird, MD    Family History Family History  Problem Relation Age of Onset  . Alzheimer's disease Father   . Dementia Mother   . Cancer Daughter     Social History Social History  Substance Use Topics  . Smoking status: Former Smoker    Packs/day: 3.00    Years: 50.00    Types: Cigarettes    Quit date: 12/14/1998  . Smokeless tobacco: Never Used  . Alcohol use No     Comment: daily     Allergies   Lipitor [atorvastatin]; Statins; Zetia [ezetimibe]; and Ambien [zolpidem tartrate]   Review of Systems Review of Systems  Unable to perform ROS: Dementia      Physical Exam Updated Vital Signs BP 125/61 (BP Location: Left Arm)   Pulse 82   Temp 99.4 F (37.4 C) (Oral)   Resp 24   Ht 5' 7.5" (1.715 m)   Wt 180 lb (81.6 kg)   SpO2 96%   BMI 27.78 kg/m   Physical Exam  Constitutional: He appears well-developed and well-nourished. No distress.  Spitting secretions into emesis bag  HENT:  Head: Normocephalic and atraumatic.  Moist mucous membranes  Eyes: Conjunctivae are normal. Pupils are equal, round, and reactive to light.  Neck: Neck supple.  Cardiovascular: Normal rate and regular rhythm.   Murmur heard.  Systolic murmur is present with a grade of 3/6  Pulmonary/Chest: Effort normal and breath sounds normal. No respiratory distress.  Abdominal: Soft. Bowel sounds are normal. He exhibits no distension. There is no tenderness.  Musculoskeletal: He exhibits no edema.  Neurological: He is alert.  Oriented to person and place  Skin: Skin is warm and dry.  Psychiatric:  Calm, pleasant  Nursing note and vitals reviewed.    ED Treatments / Results  Labs (all labs ordered are listed, but only abnormal results are displayed) Labs Reviewed - No data to display  EKG  EKG Interpretation None       Radiology Dg Chest 2 View  Result Date: 07/20/2016 CLINICAL DATA:  Possible food bolus within the esophagus EXAM: CHEST  2 VIEW COMPARISON:   05/08/2016 FINDINGS: Cardiac shadow is within normal limits. No focal infiltrate or sizable effusion is noted. Aortic calcifications are again seen. Calcified pleural parenchymal scarring is again noted. IMPRESSION: No acute abnormality seen. Electronically Signed   By: Alcide Clever M.D.   On: 07/20/2016 16:16    Procedures Procedures (including critical care time)  Medications Ordered in ED Medications  glucagon (human recombinant) (GLUCAGEN) injection 1 mg (1 mg Intravenous Given 07/20/16 1646)     Initial Impression / Assessment and Plan / ED Course  I have reviewed the triage vital signs and the nursing notes.  Pertinent imaging results that were available during my care of the patient were reviewed by me and considered in my medical decision making (see chart for details).  Clinical Course   Patient with history of multiple food impactions requiring endoscopy presents with sensation of food bolus stuck in his throat. On exam, he was nontoxic and breathing comfortably. He was  spitting into an emesis bag and unable to swallow his secretions but otherwise was comfortable. Chest x-ray normal. Gave a dose of glucagon and later contacted gastroenterology and discussed w/ Dr. Matthias HughsBuccini, who took patient to OR for endoscopy as he had complications with moderate sedation for endoscopy previously.  Final Clinical Impressions(s) / ED Diagnoses   Final diagnoses:  Food impaction of esophagus, initial encounter    New Prescriptions New Prescriptions   No medications on file     Laurence Spatesachel Morgan Little, MD 07/21/16 [phone]

## 2016-07-20 NOTE — Transfer of Care (Signed)
Immediate Anesthesia Transfer of Care Note  Patient: Jeremy Briggs  Procedure(s) Performed: Procedure(s): ESOPHAGOGASTRODUODENOSCOPY (EGD) (N/A)  Patient Location: PACU  Anesthesia Type:General  Level of Consciousness: awake  Airway & Oxygen Therapy: Patient Spontanous Breathing and Patient connected to face mask oxygen  Post-op Assessment: Report given to RN and Post -op Vital signs reviewed and stable  Post vital signs: Reviewed and stable  Last Vitals:  Vitals:   07/20/16 1922 07/20/16 2031  BP: (!) 162/90 (!) 153/64  Pulse: 62 79  Resp: 16 12  Temp: 37.2 C 36.9 C    Last Pain:  Vitals:   07/20/16 2031  TempSrc:   PainSc: Asleep         Complications: No apparent anesthesia complications

## 2016-07-20 NOTE — Op Note (Signed)
[facility] Patient Name: Jeremy Briggs Procedure Date : 07/20/2016 MRN: [id number] Attending MD: Bernette Redbird , MD Date of Birth: 1934/09/18 CSN: [id number] Age: 82 Admit Type: Emergency Department Procedure:                Upper GI endoscopy Indications:              Foreign body in the esophagus--82 y/o w/ dementia                            and h/o recurrent food impactions (03/2016, 05/2016)                            on Eliquis developed food impaction today while at                            lunch. Is supposed to be on pureed diet but was                            given solid food. Providers:                Bernette Redbird, MD, Tomma Rakers, RN, Clearnce Sorrel, Technician, Windy Kalata, CRNA Referring MD:              Medicines:                General Anesthesia Complications:            No immediate complications. Estimated Blood Loss:     Estimated blood loss was minimal. Procedure:                Pre-Anesthesia Assessment:                           - Prior to the procedure, a History and Physical                            was performed, and patient medications and                            allergies were reviewed. The patient's tolerance of                            previous anesthesia was also reviewed. The risks                            and benefits of the procedure and the sedation                            options and risks were familiar to the patient's                            wife. All questions were answered, and informed  consent was obtained. Prior Anticoagulants: The                            patient has taken Eliquis (apixaban), last dose was                            day of procedure. ASA Grade Assessment: III - A                            patient with severe systemic disease. After                            reviewing the risks and benefits, the patient was          deemed in satisfactory condition to undergo the                            procedure.                           - General anesthesia under the supervision of a                            CRNA was determined to be medically necessary for                            this procedure based on increased risk of airway                            obstruction (oral, facial, neck, jaw abnormalities).                           After obtaining informed consent, the endoscope was                            passed under direct vision. Throughout the                            procedure, the patient's blood pressure, pulse, and                            oxygen saturations were monitored continuously. The                            [id number] ([id number]) scope was introduced through the                            mouth, and advanced to the antrum of the stomach.                            The upper GI endoscopy was accomplished without                            difficulty. The patient tolerated the procedure  well. Scope In: Scope Out: Findings:      Food was found at the cricopharyngeus and in the entire esophagus.       Removal of food was accomplished by suction, forceps, snare, and Roth       Radiation protection practitionerretrieval basket. Eventually, the main bolus of food slid into the       stomach, and the residual particulate matter was pushed with the       endoscope into the gastric lumen.      A medium amount of food (residue) was found on the greater curvature of       the stomach at the conclusion of the procedure, but there did not appear       to be particulate matter remaining in either the esophagus or the       hypopharynx. There was no endoscopic evidence of pharyngeal hematoma (on       anticoagulation) nor of active bleeding (on anticoagulation) at the       conclusion of the procedure, although a small amount of fresh blood was       seen in the hypopharynx during the procedure,  probably from irritation       from the suction catheter.      There was a fairly well-defined, but not particularly tight, esophageal       ring at the GE junction. It offered no resistance to passage of the 10       mm endoscope. Impression:               - Food at the cricopharyngeus and in the esophagus.                            Removal was successful.                           - A medium amount of food (residue) in the stomach.                           - Esophageal ring. Moderate Sedation:      This patient was sedated with monitored anesthesia care, not moderate       sedation. Recommendation:           - Pureed diet indefinitely.                           - Continue present medications.                           - Return to GI office at appointment to be                            scheduled. Procedure Code(s):        --- Professional ---                           [phone], 52, Esophagogastroduodenoscopy, flexible,                            transoral; with removal of foreign body(s) Diagnosis Code(s):        --- Professional ---  Z61.096E, Food in esophagus causing other injury,                            initial encounter CPT copyright 2016 [employer] Medical [employer]. All rights reserved. The codes documented in this report are preliminary and upon coder review may  be revised to meet current compliance requirements. Bernette Redbird, MD 07/20/2016 8:48:25 PM This report has been signed electronically. Number of Addenda: 0

## 2016-07-21 ENCOUNTER — Encounter (HOSPITAL_COMMUNITY): Payer: Self-pay | Admitting: Gastroenterology

## 2016-09-17 ENCOUNTER — Encounter: Payer: Self-pay | Admitting: Podiatry

## 2016-09-17 ENCOUNTER — Ambulatory Visit (INDEPENDENT_AMBULATORY_CARE_PROVIDER_SITE_OTHER): Payer: Medicare Other | Admitting: Podiatry

## 2016-09-17 DIAGNOSIS — B351 Tinea unguium: Secondary | ICD-10-CM | POA: Diagnosis not present

## 2016-09-17 DIAGNOSIS — M79676 Pain in unspecified toe(s): Secondary | ICD-10-CM | POA: Diagnosis not present

## 2016-09-17 NOTE — Progress Notes (Signed)
Patient ID: Jeremy Briggs, male   DOB: Mar 01, 1934, 82 y.o.   MRN: [id number]  Subjective: 82 y.o. returns the office today for painful, elongated, thickened toenails which he cannot trim himself. Denies any redness or drainage around the nails. Denies any acute changes since last appointment and no new complaints today. Denies any systemic complaints such as fevers, chills, nausea, vomiting.   Objective: AAO 3, NAD DP/PT pulses palpable, CRT less than 3 seconds Nails hypertrophic, dystrophic, elongated, brittle, discolored 10. There is tenderness overlying the nails 1-5 bilaterally. There is no surrounding erythema or drainage along the nail sites. No open lesions or pre-ulcerative lesions are identified. No other areas of tenderness bilateral lower extremities. No overlying edema, erythema, increased warmth. No pain with calf compression, swelling, warmth, erythema.  Assessment: Patient presents with symptomatic onychomycosis  Plan: -Treatment options including alternatives, risks, complications were discussed -Nails sharply debrided 10 without complication/bleeding. -Discussed daily foot inspection. If there are any changes, to call the office immediately.  -Follow-up in 9 weeks or sooner if any problems are to arise. In the meantime, encouraged to call the office with any questions, concerns, changes symptoms.  Ovid CurdMatthew Wagoner, DPM

## 2016-09-25 ENCOUNTER — Encounter (HOSPITAL_COMMUNITY): Payer: Self-pay | Admitting: Emergency Medicine

## 2016-09-25 ENCOUNTER — Ambulatory Visit (HOSPITAL_COMMUNITY)
Admission: EM | Admit: 2016-09-25 | Discharge: 2016-09-26 | Disposition: A | Discharge status: Home / Self Care | Payer: Medicare Other | Attending: Emergency Medicine | Admitting: Emergency Medicine

## 2016-09-25 DIAGNOSIS — I10 Essential (primary) hypertension: Secondary | ICD-10-CM | POA: Diagnosis not present

## 2016-09-25 DIAGNOSIS — K210 Gastro-esophageal reflux disease with esophagitis: Secondary | ICD-10-CM | POA: Diagnosis not present

## 2016-09-25 DIAGNOSIS — I4891 Unspecified atrial fibrillation: Secondary | ICD-10-CM | POA: Insufficient documentation

## 2016-09-25 DIAGNOSIS — G3183 Dementia with Lewy bodies: Secondary | ICD-10-CM | POA: Diagnosis not present

## 2016-09-25 DIAGNOSIS — M1990 Unspecified osteoarthritis, unspecified site: Secondary | ICD-10-CM | POA: Diagnosis not present

## 2016-09-25 DIAGNOSIS — K222 Esophageal obstruction: Secondary | ICD-10-CM

## 2016-09-25 DIAGNOSIS — X58XXXA Exposure to other specified factors, initial encounter: Secondary | ICD-10-CM | POA: Insufficient documentation

## 2016-09-25 DIAGNOSIS — T18128A Food in esophagus causing other injury, initial encounter: Secondary | ICD-10-CM | POA: Diagnosis not present

## 2016-09-25 DIAGNOSIS — F17210 Nicotine dependence, cigarettes, uncomplicated: Secondary | ICD-10-CM | POA: Insufficient documentation

## 2016-09-25 DIAGNOSIS — Z87891 Personal history of nicotine dependence: Secondary | ICD-10-CM | POA: Insufficient documentation

## 2016-09-25 DIAGNOSIS — E785 Hyperlipidemia, unspecified: Secondary | ICD-10-CM | POA: Diagnosis not present

## 2016-09-25 MED ORDER — SODIUM CHLORIDE 0.9 % IV BOLUS (SEPSIS)
500.0000 mL | Freq: Once | INTRAVENOUS | Status: AC
  Administered 2016-09-25: 500 mL via INTRAVENOUS

## 2016-09-25 NOTE — ED Triage Notes (Signed)
Patient arrives with complaint of inability to swallow food. Tonight around 1900 patient began to vomit after eating. Patient with multiple recent episodes of the same. Wife states that patient has been eating pureed foods only as a result of other recent episodes. Currently not tolerating his own saliva. Patient can speak.

## 2016-09-25 NOTE — ED Provider Notes (Signed)
[facility] DEPT Provider Note   CSN: [id number] Arrival date & time: 09/25/16  2154  By signing my name below, I, Jeremy Briggs, attest that this documentation has been prepared under the direction and in the presence of Linwood DibblesJon Beryl Balz, MD . Electronically Signed: Christy SartoriusAnastasia Briggs, Scribe. 09/25/2016. 11:32 PM.  History   Chief Complaint Chief Complaint  Patient presents with  . Food Intolerance   The history is provided by the patient, medical records and the spouse. No language interpreter was used.    HPI Comments:  Jeremy Briggs is a 82 y.o. male who presents to the Emergency Department complaining of difficulty swallowing.  Pt's wife adds that he can't manage oral secretions or swallow liquid.  His last attempt to drink was water just PTA.  Pt's wife states pt has a history of esophogeal blockage ; these symptoms are similar.  Pt normally has an esophogeal endoscopy to relieve these episodes; he had one in August and June of this year.  Pt's wife states he has to eat pureed food; tonight he was eating when he began coughing.  He continued to eat after his wife tried to stop him. Since that time he has not been able to drink without spitting up and he has to constantly spit out his saliva.  [facility] week to have his esophagus stretched.  [facility]  Pt is on Eliquis and has history of dementia. He has taken his medication tonight. Pt denies diarrhea, trouble breathing, fever and pain.    Past Medical History:  Diagnosis Date  . Arthritis   . Dementia   . Esophageal stricture   . GERD (gastroesophageal reflux disease)   . Hyperlipemia   . Hypertension     Patient Active Problem List   Diagnosis Date Noted  . Aspiration pneumonia (HCC) 03/27/2016  . Prolonged QT interval 03/27/2016  . Aspiration pneumonia of left lower lobe (HCC)   . Dementia with Lewy bodies 12/21/2013  . Current use of long term  anticoagulation   . Atherosclerosis   . Atrial flutter 08/13/2013  . Multiple duodenal ulcers 08/13/2013  . Food impaction of esophagus, S/P EGD 9/8 08/13/2013  . Hypertension   . Hyperlipemia   . GERD (gastroesophageal reflux disease)   . Dementia     Past Surgical History:  Procedure Laterality Date  . CARDIOVERSION N/A 09/13/2013   Procedure: CARDIOVERSION;  Surgeon: Othella BoyerWilliam S Tilley, MD;  [facility];  Service: Cardiovascular;  Laterality: N/A;  . esophageal stret    . ESOPHAGOGASTRODUODENOSCOPY  01/27/2012   Procedure: ESOPHAGOGASTRODUODENOSCOPY (EGD);  Surgeon: Petra KubaMarc E Magod, MD;  [facility];  Service: Endoscopy;  Laterality: N/A;  . ESOPHAGOGASTRODUODENOSCOPY N/A 08/13/2013   Procedure: ESOPHAGOGASTRODUODENOSCOPY (EGD);  Surgeon: Vertell NovakJames L Edwards Jr., MD;  [facility];  Service: Endoscopy;  Laterality: N/A;  . ESOPHAGOGASTRODUODENOSCOPY N/A 03/24/2016   Procedure: ESOPHAGOGASTRODUODENOSCOPY (EGD);  Surgeon: Bernette Redbirdobert Buccini, MD;  [facility];  Service: Endoscopy;  Laterality: N/A;  . ESOPHAGOGASTRODUODENOSCOPY N/A 05/08/2016   Procedure: ESOPHAGOGASTRODUODENOSCOPY (EGD);  Surgeon: Charlott RakesVincent Schooler, MD;  [facility];  Service: Endoscopy;  Laterality: N/A;  . ESOPHAGOGASTRODUODENOSCOPY N/A 07/20/2016   Procedure: ESOPHAGOGASTRODUODENOSCOPY (EGD);  Surgeon: Bernette Redbirdobert Buccini, MD;  [facility];  Service: Endoscopy;  Laterality: N/A;  . ESOPHAGOGASTRODUODENOSCOPY (EGD) WITH PROPOFOL  03/24/2016   Procedure: ESOPHAGOGASTRODUODENOSCOPY (EGD) WITH PROPOFOL;  Surgeon: Bernette Redbirdobert Buccini, MD;  [facility];  Service: Endoscopy;;  .  FOREIGN BODY REMOVAL N/A 08/13/2013   Procedure: FOREIGN BODY REMOVAL;  Surgeon: Vertell Novak., MD;  [facility];  Service: Endoscopy;  Laterality: N/A;       Home Medications    Prior to Admission medications   Medication Sig Start Date End Date Taking? Authorizing Provider    apixaban (ELIQUIS) 5 MG TABS tablet Take 1 tablet (5 mg total) by mouth 2 (two) times daily. 08/14/13  Yes Elease Etienne, MD  citalopram (CELEXA) 40 MG tablet Take 40 mg by mouth every morning.    Yes Historical Provider, MD  clonazePAM (KLONOPIN) 0.5 MG tablet Take 1 tablet (0.5 mg total) by mouth at bedtime. Patient taking differently: Take 1.5 mg by mouth at bedtime.  03/26/14  Yes Suanne Marker, MD  CYANOCOBALAMIN PO Take 1 tablet by mouth daily.   Yes Historical Provider, MD  donepezil (ARICEPT) 10 MG tablet Take 1 tablet (10 mg total) by mouth at bedtime. 03/26/14  Yes Suanne Marker, MD  escitalopram (LEXAPRO) 20 MG tablet Take 20 mg by mouth 2 (two) times daily.   Yes Historical Provider, MD  memantine (NAMENDA) 5 MG tablet Take 5-10 mg by mouth See admin instructions. Take 2 tablets every morning and take 1 tablet every evening   Yes Historical Provider, MD  omeprazole (PRILOSEC) 20 MG capsule Take 2 capsules (40 mg total) by mouth 2 (two) times daily before a meal. 03/24/16  Yes Bernette Redbird, MD  Vitamin D, Ergocalciferol, (DRISDOL) 50000 units CAPS capsule Take 50,000 Units by mouth every 7 (seven) days.   Yes Historical Provider, MD    Family History Family History  Problem Relation Age of Onset  . Alzheimer's disease Father   . Dementia Mother   . Cancer Daughter     Social History Social History  Substance Use Topics  . Smoking status: Former Smoker    Packs/day: 3.00    Years: 50.00    Types: Cigarettes    Quit date: 12/14/1998  . Smokeless tobacco: Never Used  . Alcohol use No     Comment: daily     Allergies   Lipitor [atorvastatin]; Statins; Zetia [ezetimibe]; and Ambien [zolpidem tartrate]   Review of Systems Review of Systems  All other systems reviewed and are negative.    Physical Exam Updated Vital Signs BP 121/60   Pulse (!) 53   Temp 99.3 F (37.4 C) (Oral)   Resp 13   SpO2 97%   Physical Exam  Constitutional: He appears  well-developed and well-nourished. No distress.  HENT:  Head: Normocephalic and atraumatic.  Right Ear: External ear normal.  Left Ear: External ear normal.  Nose: Nose normal.  Mouth/Throat: No oropharyngeal exudate.  Frequently spitting up saliva into emesis bag. No foreign body in the visible op  Eyes: Conjunctivae are normal. Right eye exhibits no discharge. Left eye exhibits no discharge. No scleral icterus.  Neck: Neck supple. No tracheal deviation present. No thyromegaly present.  Cardiovascular: Normal rate, regular rhythm, normal heart sounds and intact distal pulses.   Pulmonary/Chest: Effort normal and breath sounds normal. No stridor. No respiratory distress. He has no wheezes. He has no rales.  Abdominal: Soft. Bowel sounds are normal. He exhibits no distension and no mass. There is no tenderness. There is no rebound and no guarding. No hernia.  Musculoskeletal: He exhibits no edema or tenderness.  Neurological: He is alert. He has normal strength. No cranial nerve deficit (no facial droop, extraocular movements intact, no  slurred speech) or sensory deficit. He exhibits normal muscle tone. He displays no seizure activity. Coordination normal.  Skin: Skin is warm and dry. No rash noted. He is not diaphoretic.  Psychiatric: He has a normal mood and affect.  Nursing note and vitals reviewed.    ED Treatments / Results   DIAGNOSTIC STUDIES:  Oxygen Saturation is 97% on RA, NML by my interpretation.    COORDINATION OF CARE:  11:31 PM Discussed treatment plan with pt at bedside and pt agreed to plan.  Labs (all labs ordered are listed, but only abnormal results are displayed) Labs Reviewed  BASIC METABOLIC PANEL - Abnormal; Notable for the following:       Result Value   Glucose, Bld 108 (*)    All other components within normal limits  CBC    EKG  EKG Interpretation  Date/Time:  Sunday September 26 2016 00:27:20 EDT Ventricular Rate:  54 PR Interval:    QRS  Duration: 105 QT Interval:  458 QTC Calculation: 434 R Axis:   54 Text Interpretation:  Sinus rhythm Prolonged PR interval Borderline repolarization abnormality No significant change since last tracing Confirmed by Blayne Frankie  MD-J, Marcayla Budge ([id number]) on 09/26/2016 12:47:40 AM       Radiology No results found.  Procedures Procedures (including critical care time)  Medications Ordered in ED Medications  dextrose 5 % and 0.45 % NaCl with KCl 20 mEq/L infusion (not administered)  sodium chloride 0.9 % bolus 500 mL (500 mLs Intravenous New Bag/Given 09/25/16 2336)     Initial Impression / Assessment and Plan / ED Course  I have reviewed the triage vital signs and the nursing notes.  Pertinent labs & imaging results that were available during my care of the patient were reviewed by me and considered in my medical decision making (see chart for details).  Clinical Course  Comment By Time  Discussed case with Dr Ewing Schlein.  We reviewed prior procedures.  Pt had a complex food impaction the last time.   Pt required intubation and anesthesia assistance.  Plan for the same procedure today.  He discussed with anesthesia.  Anticipate procedure first thing in the am at 0700. Linwood Dibbles, MD 10/22 0008   Will keep pt npo.  Start maintenance fluid.  Observe in the ED until the procedure in the am.  Final Clinical Impressions(s) / ED Diagnoses   Final diagnoses:  Esophageal obstruction due to food impaction    I personally performed the services described in this documentation, which was scribed in my presence.  The recorded information has been reviewed and is accurate.     Linwood Dibbles, MD 09/26/16 [phone]

## 2016-09-26 ENCOUNTER — Emergency Department (HOSPITAL_COMMUNITY): Payer: Medicare Other | Admitting: Anesthesiology

## 2016-09-26 ENCOUNTER — Encounter (HOSPITAL_COMMUNITY): Payer: Self-pay

## 2016-09-26 ENCOUNTER — Encounter (HOSPITAL_COMMUNITY): Payer: Self-pay | Admitting: Anesthesiology

## 2016-09-26 ENCOUNTER — Encounter (HOSPITAL_COMMUNITY)
Admission: EM | Disposition: A | Discharge status: Home / Self Care | Payer: Self-pay | Source: Home / Self Care | Attending: Emergency Medicine

## 2016-09-26 DIAGNOSIS — I10 Essential (primary) hypertension: Secondary | ICD-10-CM | POA: Diagnosis not present

## 2016-09-26 DIAGNOSIS — F17210 Nicotine dependence, cigarettes, uncomplicated: Secondary | ICD-10-CM | POA: Diagnosis not present

## 2016-09-26 DIAGNOSIS — T18128A Food in esophagus causing other injury, initial encounter: Secondary | ICD-10-CM | POA: Diagnosis not present

## 2016-09-26 DIAGNOSIS — K210 Gastro-esophageal reflux disease with esophagitis: Secondary | ICD-10-CM | POA: Diagnosis not present

## 2016-09-26 HISTORY — PX: ESOPHAGOGASTRODUODENOSCOPY (EGD) WITH PROPOFOL: SHX5813

## 2016-09-26 LAB — BASIC METABOLIC PANEL
Anion gap: 6 (ref 5–15)
BUN: 16 mg/dL (ref 6–20)
CALCIUM: 9.4 mg/dL (ref 8.9–10.3)
CO2: 27 mmol/L (ref 22–32)
CREATININE: 1.06 mg/dL (ref 0.61–1.24)
Chloride: 104 mmol/L (ref 101–111)
GFR calc non Af Amer: >60 mL/min (ref >60)
Glucose, Bld: 108 mg/dL — ABNORMAL HIGH (ref 65–99)
Potassium: 3.8 mmol/L (ref 3.5–5.1)
SODIUM: 137 mmol/L (ref 135–145)

## 2016-09-26 LAB — CBC
HEMATOCRIT: 44.6 % (ref 39.0–52.0)
Hemoglobin: 15 g/dL (ref 13.0–17.0)
MCH: 30.2 pg (ref 26.0–34.0)
MCHC: 33.6 g/dL (ref 30.0–36.0)
MCV: 89.7 fL (ref 78.0–100.0)
Platelets: 223 10*3/uL (ref 150–400)
RBC: 4.97 MIL/uL (ref 4.22–5.81)
RDW: 13.1 % (ref 11.5–15.5)
WBC: 9.7 10*3/uL (ref 4.0–10.5)

## 2016-09-26 SURGERY — ESOPHAGOGASTRODUODENOSCOPY (EGD) WITH PROPOFOL
Anesthesia: General

## 2016-09-26 SURGERY — CANCELLED PROCEDURE

## 2016-09-26 MED ORDER — ONDANSETRON HCL 4 MG/2ML IJ SOLN
INTRAMUSCULAR | Status: AC
  Filled 2016-09-26: qty 2

## 2016-09-26 MED ORDER — KCL IN DEXTROSE-NACL 20-5-0.45 MEQ/L-%-% IV SOLN
Freq: Once | INTRAVENOUS | Status: AC
  Administered 2016-09-26: 06:00:00 via INTRAVENOUS
  Filled 2016-09-26: qty 1000

## 2016-09-26 MED ORDER — LACTATED RINGERS IV SOLN
INTRAVENOUS | Status: DC | PRN
  Administered 2016-09-26: 07:00:00 via INTRAVENOUS

## 2016-09-26 MED ORDER — PROPOFOL 10 MG/ML IV BOLUS
INTRAVENOUS | Status: DC | PRN
  Administered 2016-09-26: 80 mg via INTRAVENOUS
  Administered 2016-09-26: 30 mg via INTRAVENOUS

## 2016-09-26 MED ORDER — SUCCINYLCHOLINE CHLORIDE 200 MG/10ML IV SOSY
PREFILLED_SYRINGE | INTRAVENOUS | Status: DC | PRN
  Administered 2016-09-26: 100 mg via INTRAVENOUS

## 2016-09-26 MED ORDER — SUCCINYLCHOLINE CHLORIDE 200 MG/10ML IV SOSY
PREFILLED_SYRINGE | INTRAVENOUS | Status: AC
  Filled 2016-09-26: qty 10

## 2016-09-26 MED ORDER — LIDOCAINE 2% (20 MG/ML) 5 ML SYRINGE
INTRAMUSCULAR | Status: AC
  Filled 2016-09-26: qty 5

## 2016-09-26 MED ORDER — ONDANSETRON HCL 4 MG/2ML IJ SOLN
INTRAMUSCULAR | Status: DC | PRN
  Administered 2016-09-26: 4 mg via INTRAVENOUS

## 2016-09-26 MED ORDER — DEXAMETHASONE SODIUM PHOSPHATE 10 MG/ML IJ SOLN
INTRAMUSCULAR | Status: AC
  Filled 2016-09-26: qty 1

## 2016-09-26 MED ORDER — ROCURONIUM BROMIDE 10 MG/ML (PF) SYRINGE
PREFILLED_SYRINGE | INTRAVENOUS | Status: AC
Start: 2016-09-26 — End: 2016-09-26
  Filled 2016-09-26: qty 10

## 2016-09-26 MED ORDER — FENTANYL CITRATE (PF) 100 MCG/2ML IJ SOLN
INTRAMUSCULAR | Status: AC
  Filled 2016-09-26: qty 2

## 2016-09-26 MED ORDER — FENTANYL CITRATE (PF) 100 MCG/2ML IJ SOLN: 25.0000 ug | INTRAMUSCULAR | Status: DC | PRN

## 2016-09-26 MED ORDER — LIDOCAINE 2% (20 MG/ML) 5 ML SYRINGE
INTRAMUSCULAR | Status: DC | PRN
  Administered 2016-09-26: 80 mg via INTRAVENOUS

## 2016-09-26 MED ORDER — SODIUM CHLORIDE 0.9 % IV SOLN: INTRAVENOUS | Status: DC

## 2016-09-26 MED ORDER — PROPOFOL 10 MG/ML IV BOLUS
INTRAVENOUS | Status: AC
  Filled 2016-09-26: qty 20

## 2016-09-26 NOTE — Transfer of Care (Signed)
Immediate Anesthesia Transfer of Care Note  Patient: Jeremy BerkshireRoger N Abdo  Procedure(s) Performed: Procedure(s): ESOPHAGOGASTRODUODENOSCOPY (EGD) WITH PROPOFOL (N/A)  Patient Location: PACU  Anesthesia Type:General  Level of Consciousness: sedated  Airway & Oxygen Therapy: Patient Spontanous Breathing and Patient connected to nasal cannula oxygen  Post-op Assessment: Report given to RN and Post -op Vital signs reviewed and stable  Post vital signs: Reviewed and stable  Last Vitals:  Vitals:   09/26/16 0630 09/26/16 0702  BP: 147/68 146/63  Pulse: (!) 54 (!) 51  Resp: 16 13  Temp:      Last Pain:  Vitals:   09/26/16 0702  TempSrc: Oral  PainSc:          Complications: No apparent anesthesia complications

## 2016-09-26 NOTE — Discharge Instructions (Signed)
Liquids only today we discussed no pured chicken or meat and eat slowly and consider PEG tube and change to decaf tea and follow-up with myself buccini or Schooler as needed [facility]

## 2016-09-26 NOTE — Consult Note (Signed)
Reason for Consult: Food impaction Referring Physician: ER physician  Jeremy Briggs is an 82 y.o. male.  HPI: Patient known to my partners and case discussed with the patient and his wife in the ER physician and has had 2 other food impactions this summer and those notes were reviewed and he is about to have another dilation at the [place] next week and his been on pured foods but still has had some problems and he is on his blood thinner for A. fib and he does have dementia but no other specific complaints  Past Medical History:  Diagnosis Date  . Arthritis   . Dementia   . Esophageal stricture   . GERD (gastroesophageal reflux disease)   . Hyperlipemia   . Hypertension     Past Surgical History:  Procedure Laterality Date  . CARDIOVERSION N/A 09/13/2013   Procedure: CARDIOVERSION;  Surgeon: Jacolyn Reedy, MD;  [facility];  Service: Cardiovascular;  Laterality: N/A;  . esophageal stret    . ESOPHAGOGASTRODUODENOSCOPY  01/27/2012   Procedure: ESOPHAGOGASTRODUODENOSCOPY (EGD);  Surgeon: Jeryl Columbia, MD;  Location: [facility] ENDOSCOPY;  Service: Endoscopy;  Laterality: N/A;  . ESOPHAGOGASTRODUODENOSCOPY N/A 08/13/2013   Procedure: ESOPHAGOGASTRODUODENOSCOPY (EGD);  Surgeon: Winfield Cunas., MD;  Location: [facility];  Service: Endoscopy;  Laterality: N/A;  . ESOPHAGOGASTRODUODENOSCOPY N/A 03/24/2016   Procedure: ESOPHAGOGASTRODUODENOSCOPY (EGD);  Surgeon: Ronald Lobo, MD;  [facility]COPY;  Service: Endoscopy;  Laterality: N/A;  . ESOPHAGOGASTRODUODENOSCOPY N/A 05/08/2016   Procedure: ESOPHAGOGASTRODUODENOSCOPY (EGD);  Surgeon: Wilford Corner, MD;  Location: [facility] ENDOSCOPY;  Service: Endoscopy;  Laterality: N/A;  . ESOPHAGOGASTRODUODENOSCOPY N/A 07/20/2016   Procedure: ESOPHAGOGASTRODUODENOSCOPY (EGD);  Surgeon: Ronald Lobo, MD;  Location: [facility] ENDOSCOPY;  Service: Endoscopy;  Laterality: N/A;  . ESOPHAGOGASTRODUODENOSCOPY (EGD) WITH PROPOFOL  03/24/2016   Procedure:  ESOPHAGOGASTRODUODENOSCOPY (EGD) WITH PROPOFOL;  Surgeon: Ronald Lobo, MD;  Location: [place];  Service: Endoscopy;;  . FOREIGN BODY REMOVAL N/A 08/13/2013   Procedure: FOREIGN BODY REMOVAL;  Surgeon: Winfield Cunas., MD;  Location: [facility] ENDOSCOPY;  Service: Endoscopy;  Laterality: N/A;    Family History  Problem Relation Age of Onset  . Alzheimer's disease Father   . Dementia Mother   . Cancer Daughter     Social History:  reports that he quit smoking about 17 years ago. His smoking use included Cigarettes. He has a 150.00 pack-year smoking history. He has never used smokeless tobacco. He reports that he does not drink alcohol or use drugs.  Allergies:  Allergies  Allergen Reactions  . Lipitor [Atorvastatin] Other (See Comments)    Goes crazy  . Statins Other (See Comments)    Goes crazy  . Zetia [Ezetimibe] Other (See Comments)    Angry, violent mannerisms  . Ambien [Zolpidem Tartrate] Other (See Comments)    unknown    Medications: I have reviewed the patient's current medications.  Results for orders placed or performed during the hospital encounter of 09/25/16 (from the past 48 hour(s))  CBC     Status: None   Collection Time: 09/25/16 11:30 PM  Result Value Ref Range   WBC 9.7 4.0 - 10.5 K/uL   RBC 4.97 4.22 - 5.81 MIL/uL   Hemoglobin 15.0 13.0 - 17.0 g/dL   HCT 44.6 39.0 - 52.0 %   MCV 89.7 78.0 - 100.0 fL   MCH 30.2 26.0 - 34.0 pg   MCHC 33.6 30.0 - 36.0 g/dL   RDW 13.1 11.5 - 15.5 %  Platelets 223 150 - 400 K/uL  Basic metabolic panel     Status: Abnormal   Collection Time: 09/25/16 11:30 PM  Result Value Ref Range   Sodium 137 135 - 145 mmol/L   Potassium 3.8 3.5 - 5.1 mmol/L   Chloride 104 101 - 111 mmol/L   CO2 27 22 - 32 mmol/L   Glucose, Bld 108 (H) 65 - 99 mg/dL   BUN 16 6 - 20 mg/dL   Creatinine, Ser 1.06 0.61 - 1.24 mg/dL   Calcium 9.4 8.9 - 10.3 mg/dL   GFR calc non Af Amer >60 >60 mL/min   GFR calc Af Amer >60 >60 mL/min    Comment:  (NOTE) The eGFR has been calculated using the CKD EPI equation. This calculation has not been validated in all clinical situations. eGFR's persistently <60 mL/min signify possible Chronic Kidney Disease.    Anion gap 6 5 - 15    No results found.  ROS negative except above Blood pressure 146/63, pulse (!) 51, temperature 99.3 F (37.4 C), temperature source Oral, resp. rate 13, SpO2 99 %. Physical Exam vital signs stable afebrile no acute distress lungs clear regular rate and rhythm abdomen is a little touchy good bowel sounds he says he flinches due to my cold hands no guarding or rebound  Assessment/Plan: Food impaction Plan: Okay to proceed with endoscopy with anesthesia assistance this morning  Pascal Stiggers E 09/26/2016, 7:36 AM

## 2016-09-26 NOTE — Anesthesia Procedure Notes (Signed)
Procedure Name: Intubation Date/Time: 09/26/2016 7:43 AM Performed by: Alanda AmassFRIEDMAN, Ezmeralda Stefanick A Pre-anesthesia Checklist: Patient identified, Emergency Drugs available, Suction available, Patient being monitored and Timeout performed Patient Re-evaluated:Patient Re-evaluated prior to inductionOxygen Delivery Method: Circle System Utilized and Circle system utilized Preoxygenation: Pre-oxygenation with 100% oxygen Intubation Type: IV induction, Rapid sequence and Cricoid Pressure applied Laryngoscope Size: Mac and 3 Grade View: Grade I Tube type: Oral Tube size: 7.5 mm Number of attempts: 1 Airway Equipment and Method: Stylet Placement Confirmation: ETT inserted through vocal cords under direct vision,  positive ETCO2 and breath sounds checked- equal and bilateral Secured at: 22 cm Tube secured with: Tape Dental Injury: Teeth and Oropharynx as per pre-operative assessment

## 2016-09-26 NOTE — Anesthesia Postprocedure Evaluation (Signed)
Anesthesia Post Note  Patient: Jeremy Briggs  Procedure(s) Performed: Procedure(s) (LRB): ESOPHAGOGASTRODUODENOSCOPY (EGD) WITH PROPOFOL (N/A)  Patient location during evaluation: PACU Anesthesia Type: General Level of consciousness: awake and alert Pain management: pain level controlled Vital Signs Assessment: post-procedure vital signs reviewed and stable Respiratory status: spontaneous breathing, nonlabored ventilation, respiratory function stable and patient connected to nasal cannula oxygen Cardiovascular status: blood pressure returned to baseline and stable Postop Assessment: no signs of nausea or vomiting Anesthetic complications: no    Last Vitals:  Vitals:   09/26/16 0702 09/26/16 0817  BP: 146/63 (!) 142/70  Pulse: (!) 51 60  Resp: 13   Temp:  36.4 C    Last Pain:  Vitals:   09/26/16 0702  TempSrc: Oral  PainSc:                  Jeremy Briggs,Jeremy Briggs

## 2016-09-26 NOTE — Anesthesia Preprocedure Evaluation (Addendum)
Anesthesia Evaluation  Patient identified by MRN, date of birth, ID band Patient confused    Reviewed: Allergy & Precautions, NPO status , Patient's Chart, lab work & pertinent test results  Airway Mallampati: II  TM Distance: >3 FB Neck ROM: Full    Dental  (+) Dental Advisory Given, Edentulous Upper, Edentulous Lower   Pulmonary former smoker,    Pulmonary exam normal breath sounds clear to auscultation       Cardiovascular hypertension, Pt. on medications Normal cardiovascular exam+ dysrhythmias Atrial Fibrillation  Rhythm:Regular Rate:Normal     Neuro/Psych PSYCHIATRIC DISORDERS lewy body dementia     GI/Hepatic Neg liver ROS, GERD  Medicated,Food impaction in esophagus   Endo/Other  negative endocrine ROS  Renal/GU negative Renal ROS     Musculoskeletal  (+) Arthritis , Osteoarthritis,    Abdominal   Peds  Hematology negative hematology ROS (+)   Anesthesia Other Findings   Reproductive/Obstetrics                             Anesthesia Physical Anesthesia Plan  ASA: III  Anesthesia Plan: General   Post-op Pain Management:    Induction: Intravenous  Airway Management Planned: Oral ETT  Additional Equipment:   Intra-op Plan:   Post-operative Plan: Extubation in OR and Possible Post-op intubation/ventilation  Informed Consent: I have reviewed the patients History and Physical, chart, labs and discussed the procedure including the risks, benefits and alternatives for the proposed anesthesia with the patient or authorized representative who has indicated his/her understanding and acceptance.     Plan Discussed with: CRNA  Anesthesia Plan Comments:         Anesthesia Quick Evaluation

## 2016-09-26 NOTE — ED Notes (Signed)
Pt to Endo with Florentina AddisonKatie, RN

## 2016-09-26 NOTE — Op Note (Signed)
[facility] Patient Name: Jeremy Briggs Procedure Date : 09/26/2016 MRN: [id number] Attending MD: Vida Rigger , MD Date of Birth: [birth year] CSN: [id number] Age: 82 Admit Type: Outpatient Procedure:                Upper GI endoscopy Indications:              Foreign body in the esophagus Providers:                Vida Rigger, MD, Will Bonnet RN, RN, Kandice Robinsons, Technician Referring MD:              Medicines:                General Anesthesia Complications:            No immediate complications. Estimated Blood Loss:     Estimated blood loss: none. Procedure:                Pre-Anesthesia Assessment:                           - Prior to the procedure, a History and Physical                            was performed, and patient medications and                            allergies were reviewed. The patient's tolerance of                            previous anesthesia was also reviewed. The risks                            and benefits of the procedure and the sedation                            options and risks were discussed with the patient.                            All questions were answered, and informed consent                            was obtained. Prior Anticoagulants: The patient has                            taken Eliquis (apixaban), last dose was 1 day prior                            to procedure. ASA Grade Assessment: III - A patient                            with severe systemic disease. After reviewing the  risks and benefits, the patient was deemed in                            satisfactory condition to undergo the procedure.                           After obtaining informed consent, the endoscope was                            passed under direct vision. Throughout the                            procedure, the patient's blood pressure, pulse, and                            oxygen  saturations were monitored continuously. The                            [id number] ([id number]) scope was introduced through the                            mouth, and advanced to the second part of duodenum.                            The upper GI endoscopy was somewhat difficult due                            to presence of food. Successful completion of the                            procedure was aided by performing the maneuvers                            documented (below) in this report. The patient                            tolerated the procedure well. Scope In: Scope Out: Findings:      Food was found in the middle third of the esophagus and in the lower       third of the esophagus. Removal of food was accomplished using the small       Roth net and pushing and washing the remaining food into the stomach and       suctioning some of it as well      The entire examined stomach was normal.      The duodenal bulb, first portion of the duodenum and second portion of       the duodenum were normal.      LA Grade B (one or more mucosal breaks greater than 5 mm, not extending       between the tops of two mucosal folds) esophagitis with no bleeding was       found.there was also GE junction and distal esophageal spasm and edema      The exam was otherwise without abnormality. Impression:               -  Food in the middle third of the esophagus and in                            the lower third of the esophagus. Removal was                            successful.                           - Normal stomach.                           - Normal duodenal bulb, first portion of the                            duodenum and second portion of the duodenum.                           - LA Grade B esophagitis.                           - The examination was otherwise normal. Moderate Sedation:      moderate sedation-none Recommendation:           - Patient has a contact number available for                             emergencies. The signs and symptoms of potential                            delayed complications were discussed with the                            patient. Return to normal activities tomorrow.                            Written discharge instructions were provided to the                            patient.                           - Clear liquid diet today.                           - Continue present medications.                           - Return to GI clinic PRN.                           - Telephone GI clinic if symptomatic PRN. Procedure Code(s):        --- Professional ---                           [phone], Esophagogastroduodenoscopy, flexible,  transoral; with removal of foreign body(s) Diagnosis Code(s):        --- Professional ---                           N82.956OT18.128A, Food in esophagus causing other injury,                            initial encounter                           K20.9, Esophagitis, unspecified                           T18.108A, Unspecified foreign body in esophagus                            causing other injury, initial encounter CPT copyright 2016 [employer]. All rights reserved. The codes documented in this report are preliminary and upon coder review may  be revised to meet current compliance requirements. Vida RiggerMarc Cyniah Gossard, MD 09/26/2016 8:20:26 AM This report has been signed electronically. Number of Addenda: 0

## 2016-09-27 ENCOUNTER — Encounter (HOSPITAL_COMMUNITY): Payer: Self-pay | Admitting: Gastroenterology

## 2016-11-19 ENCOUNTER — Ambulatory Visit: Payer: Medicare Other | Admitting: Podiatry

## 2017-03-27 IMAGING — CT CT HEAD W/O CM
2 series · 14 of 30 positions shown, 16 images · non-contrast
Comparison: Head CT 11/06/2014

CLINICAL DATA: Fall.  Confusion.

EXAM:
CT HEAD WITHOUT CONTRAST
TECHNIQUE: Contiguous axial images were obtained from the base of the skull
through the vertex without intravenous contrast.

[Series 2: head without · axial · non-contrast · 0.45mm/px · z∈[-198,-73]mm · 6 of 35 slices shown, 8 images]
[im 5/35  brain]
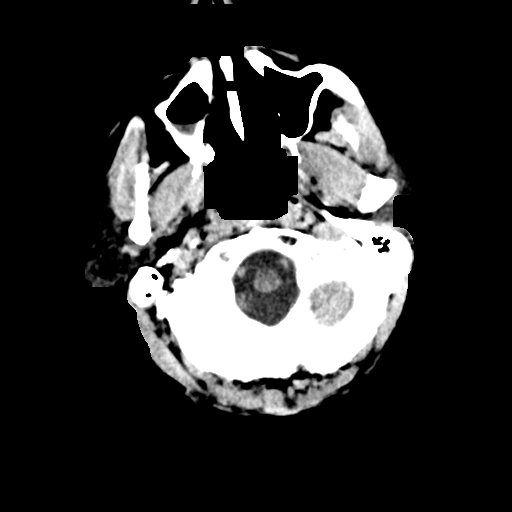
[im 5/35  bone]
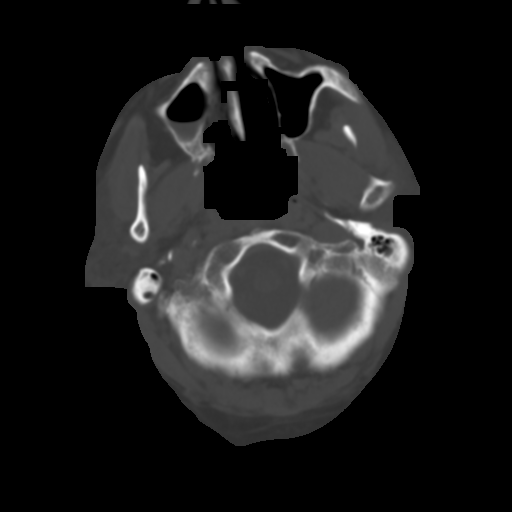
[im 10/35  brain]
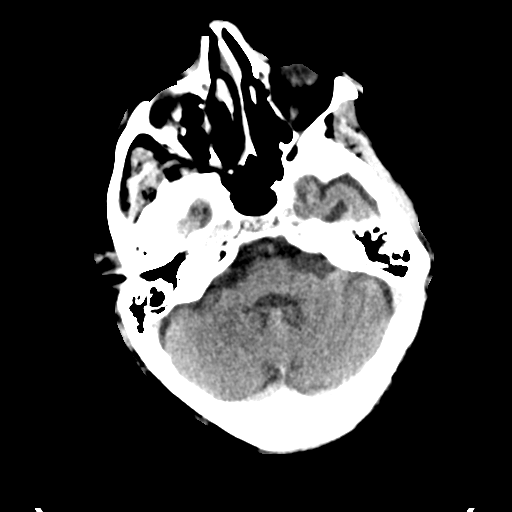
[im 15/35  brain]
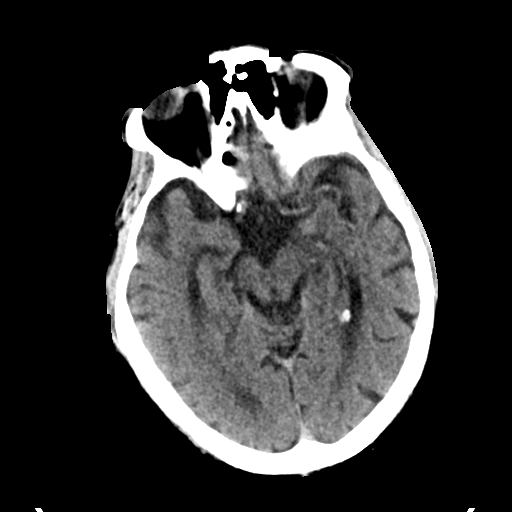
[im 20/35  brain]
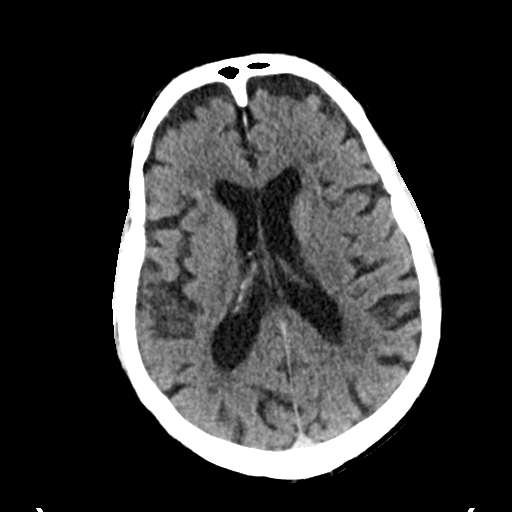
[im 25/35  brain]
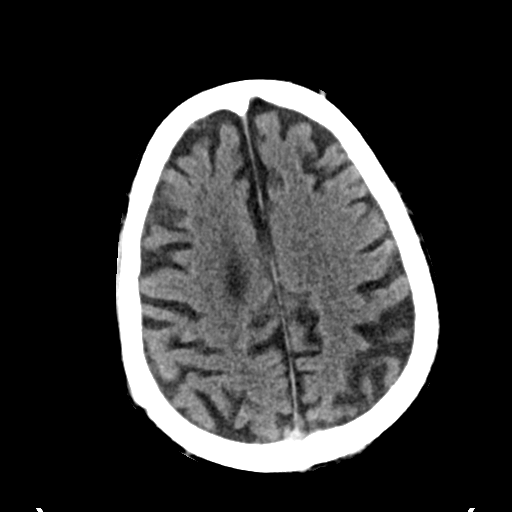
[im 25/35  bone]
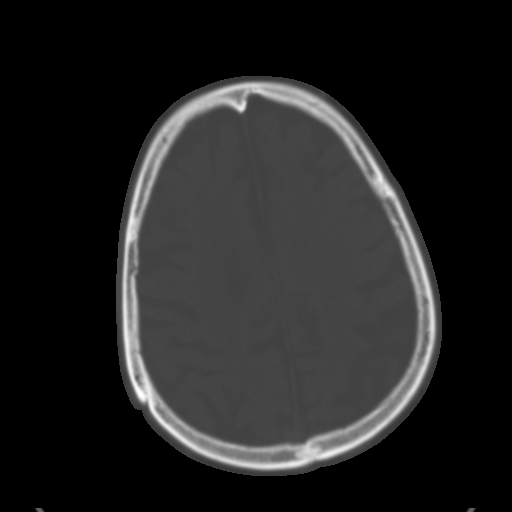
[im 30/35  brain]
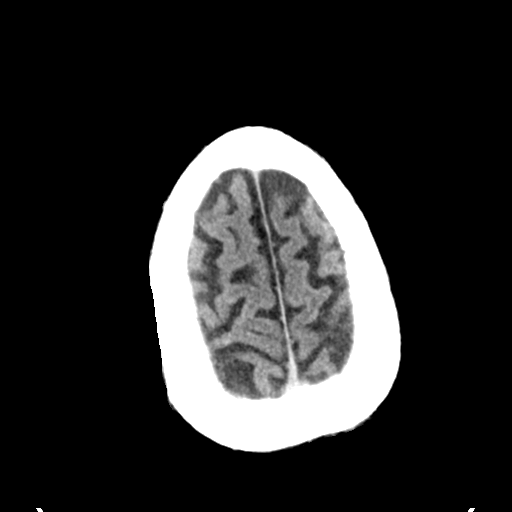

[Series 3: head bone · axial · 0.45mm/px · z∈[-202,-54]mm · 8 of 92 slices shown]
[im 9/92  bone]
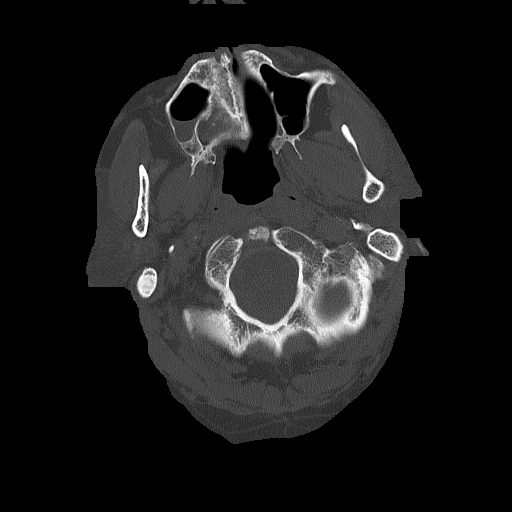
[im 18/92  bone]
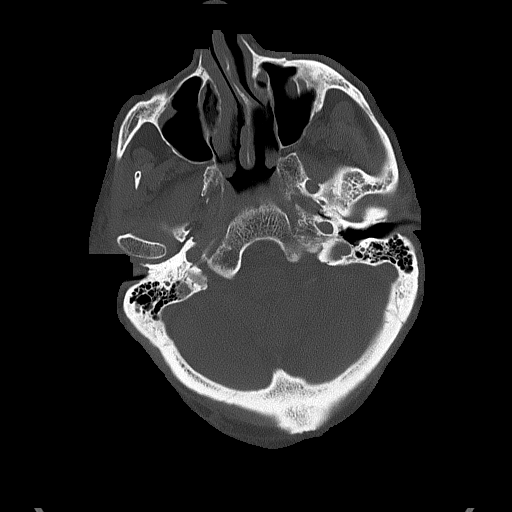
[im 31/92  bone]
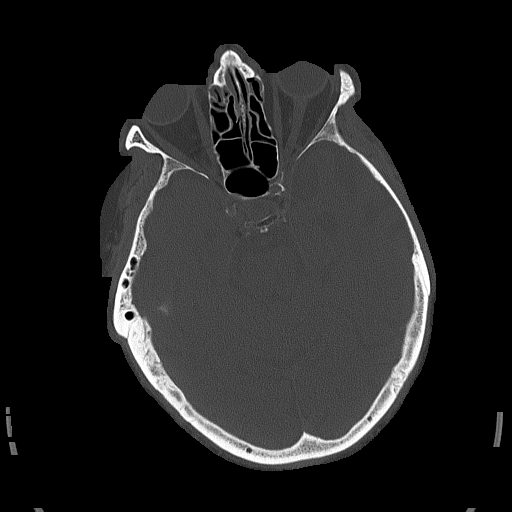
[im 40/92  bone]
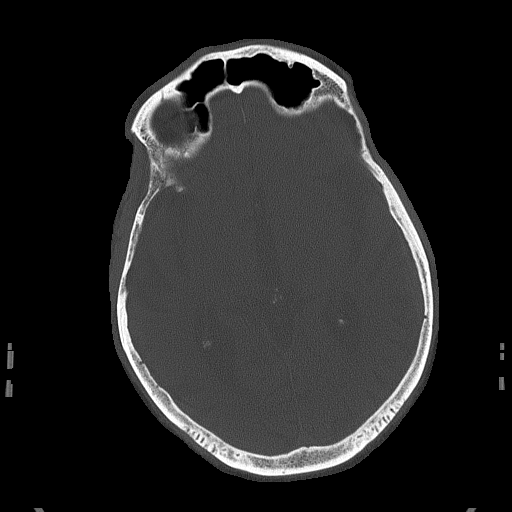
[im 53/92  bone]
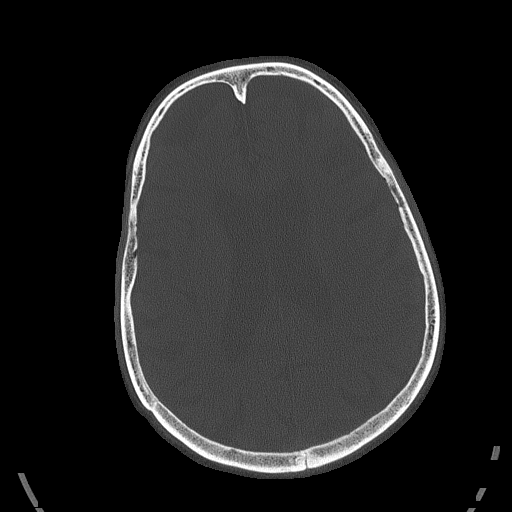
[im 61/92  bone]
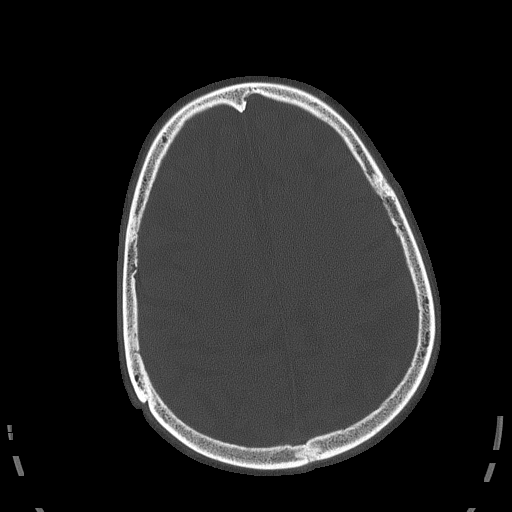
[im 74/92  bone]
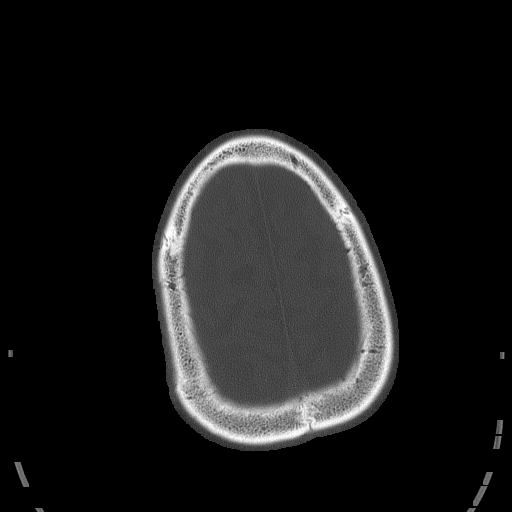
[im 83/92  bone]
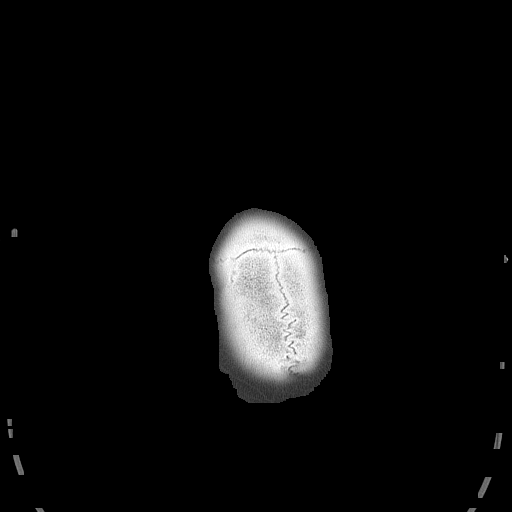

[14 of 30 positions shown; findings below may reference images not displayed]

FINDINGS: Generalized atrophy and mild chronic small vessel ischemia, stable
from prior exam.No intracranial hemorrhage, mass effect, or midline
shift. No hydrocephalus. The basilar cisterns are patent. No
evidence of territorial infarct. No intracranial fluid collection.
Calvarium is intact. The mastoid air cells are well aerated. Chronic
appearing right maxillary sinus opacity.
IMPRESSION: Stable atrophy and chronic small vessel ischemia. No CT findings of
acute intracranial abnormality.

## 2017-07-06 DEATH — deceased

## 2017-07-20 IMAGING — DX DG CHEST 2V
2 series · 2 of 2 positions shown · non-contrast
Comparison: 05/08/2016

CLINICAL DATA: Possible food bolus within the esophagus

EXAM:
CHEST  2 VIEW

[w chest pa]
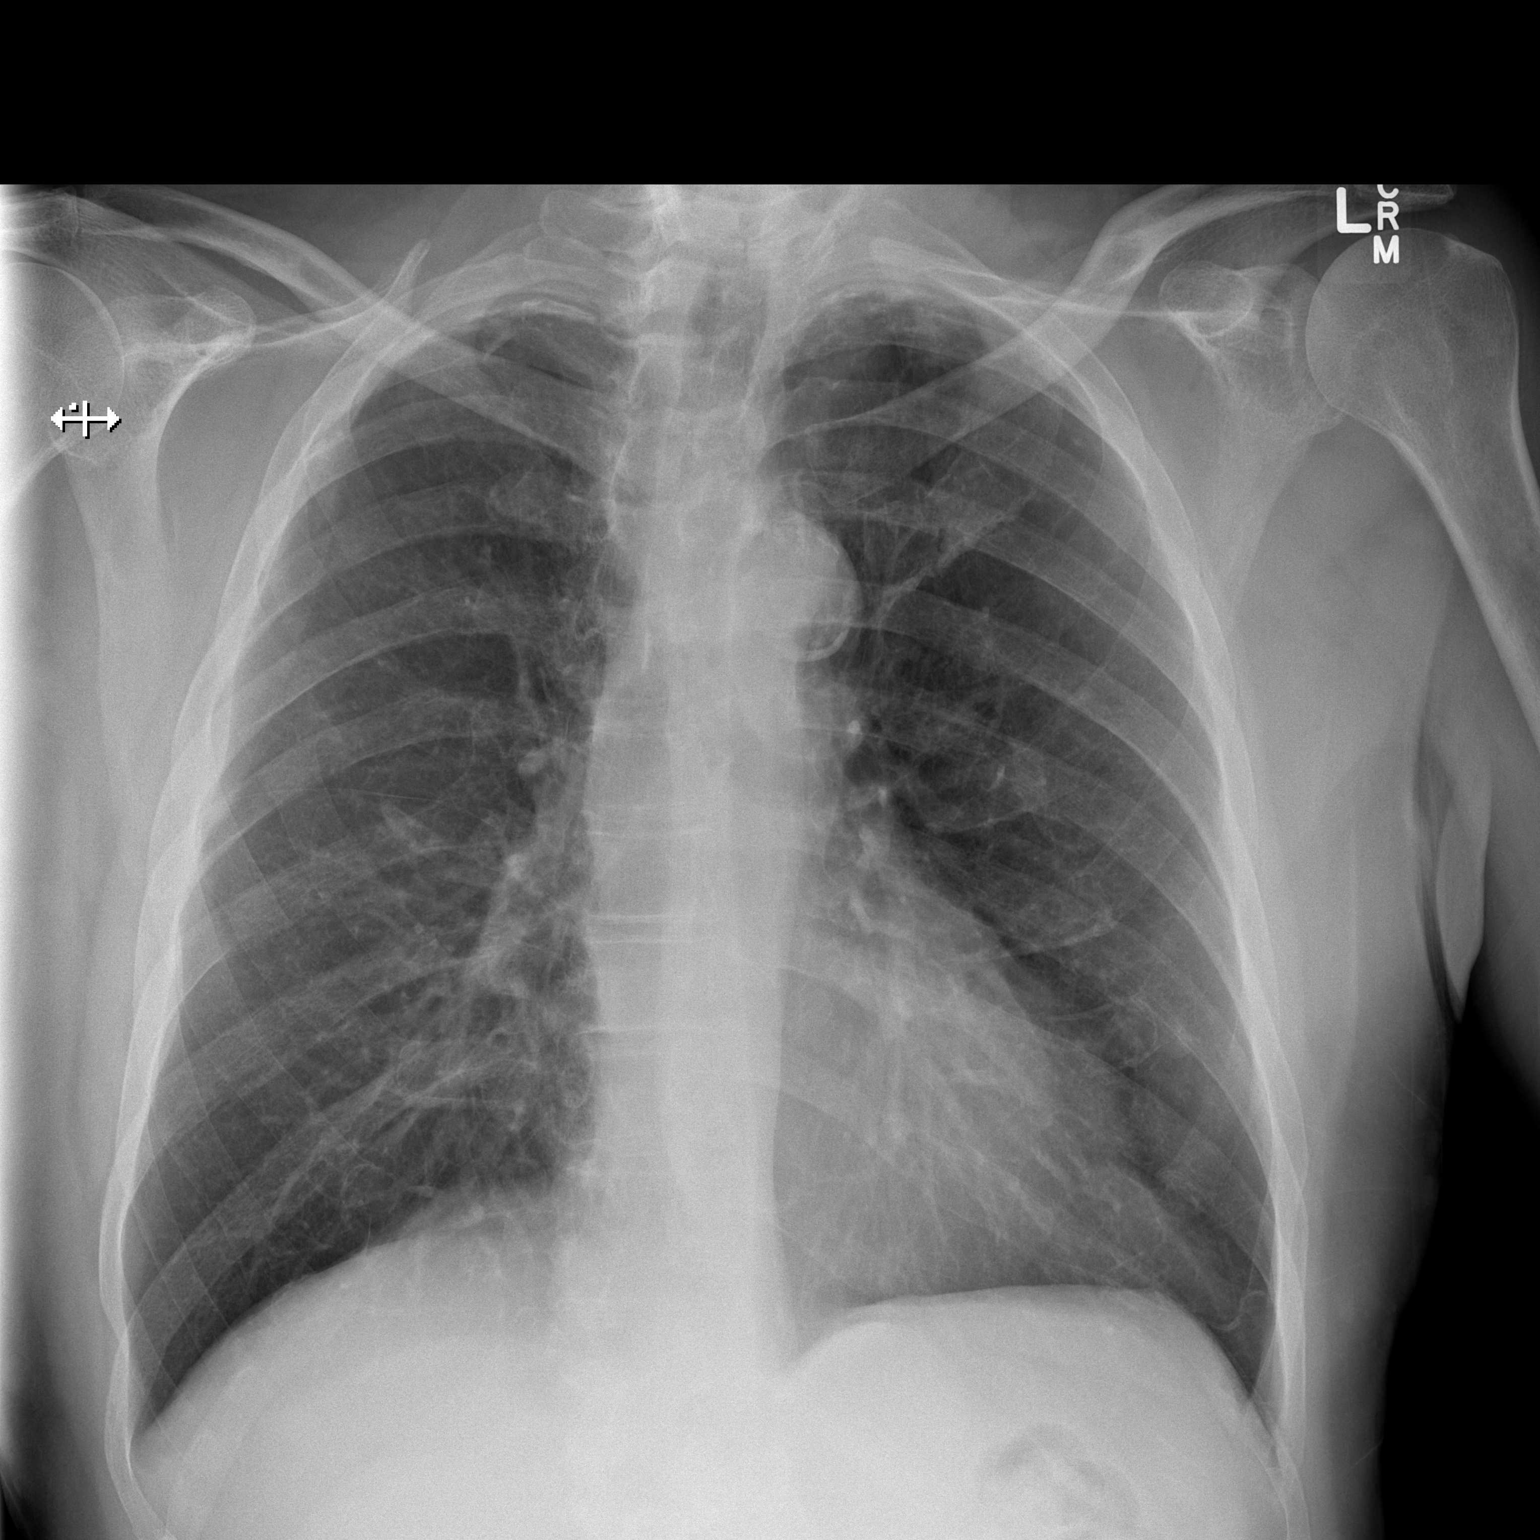

[w chest lat]
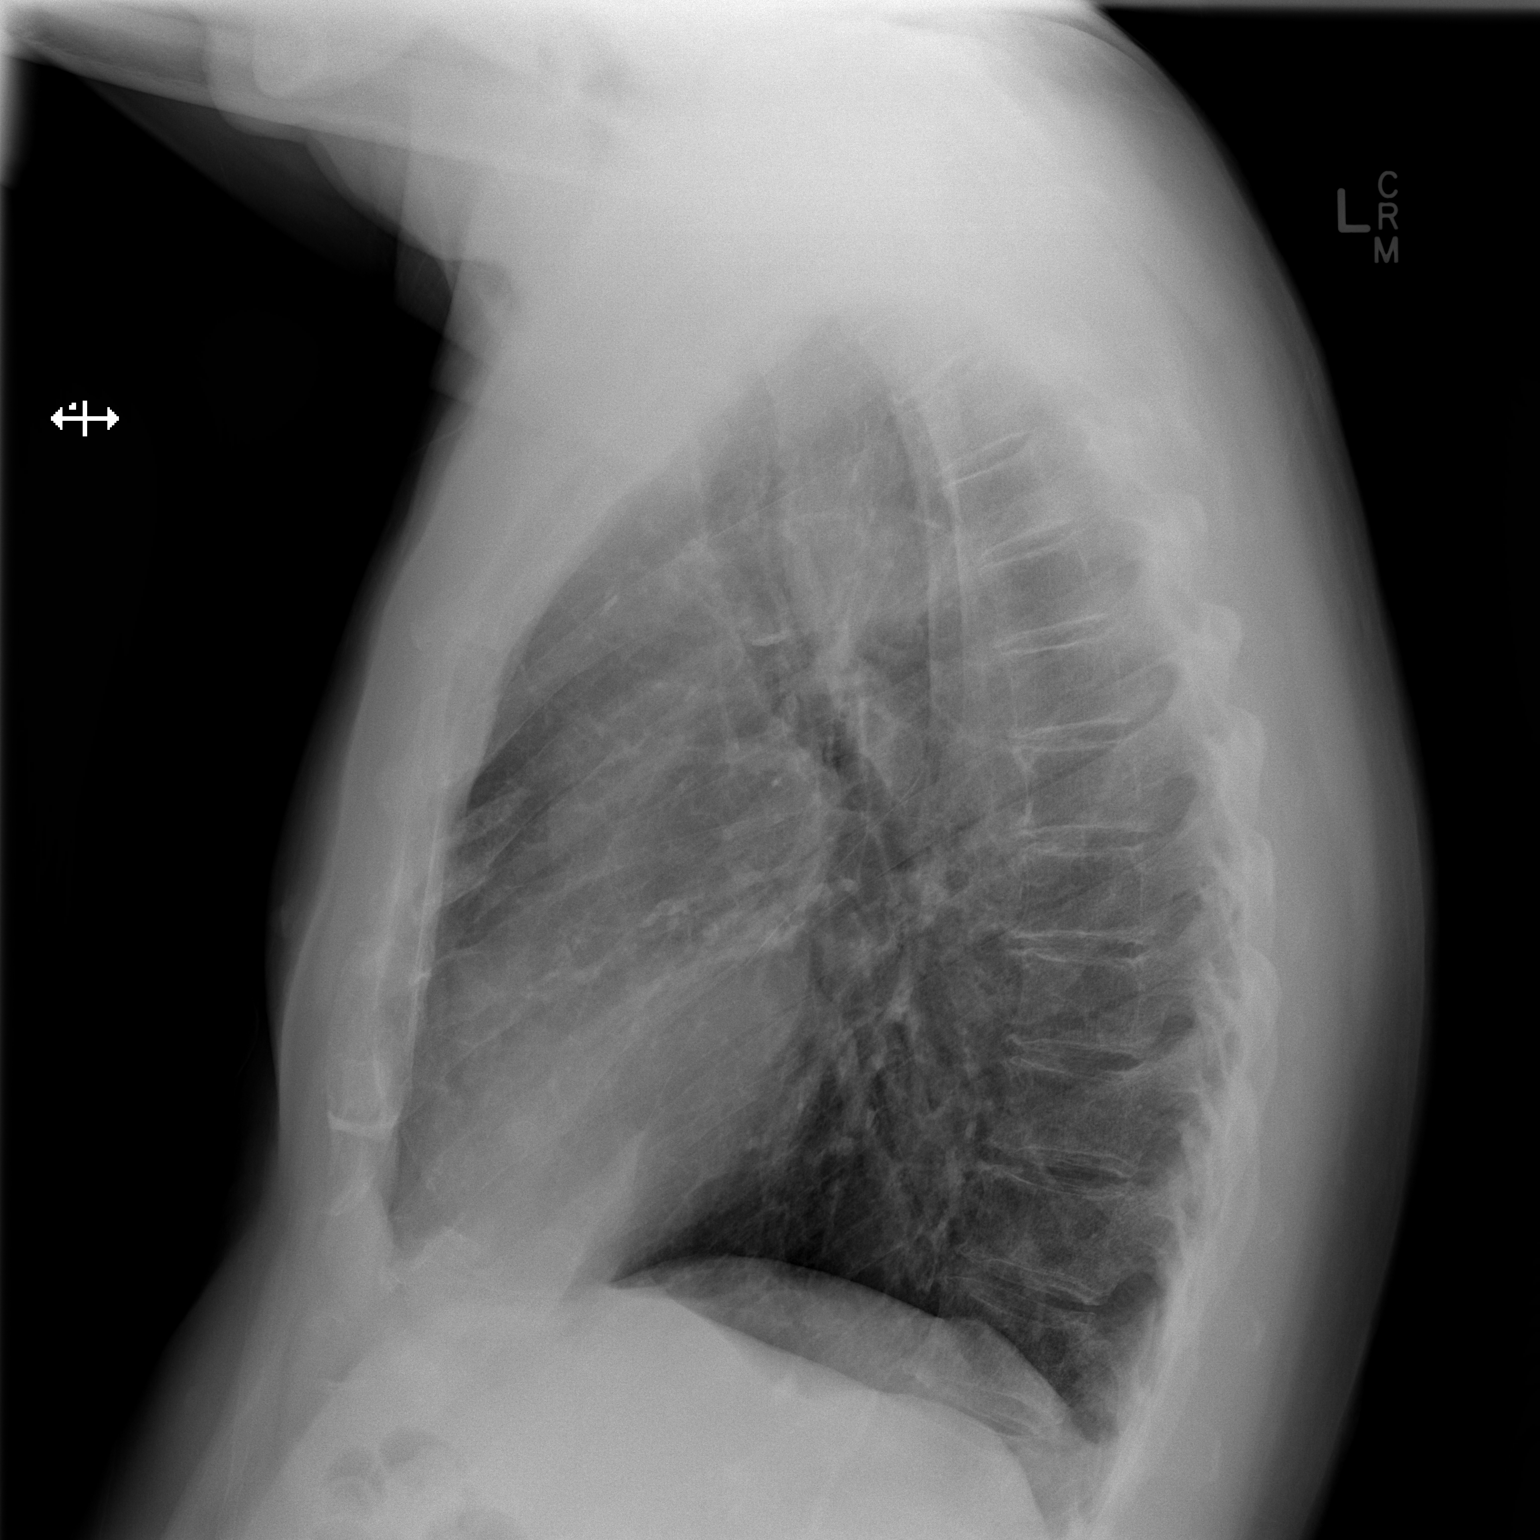

[2 of 2 positions shown; findings below may reference images not displayed]

FINDINGS: Cardiac shadow is within normal limits. No focal infiltrate or
sizable effusion is noted. Aortic calcifications are again seen.
Calcified pleural parenchymal scarring is again noted.
IMPRESSION: No acute abnormality seen.
